# Patient Record
Sex: Male | Born: 1975 | Race: White | Hispanic: No | Marital: Married | Smoking: Current every day smoker
Health system: Southern US, Community
[De-identification: ages and names within clinical notes are randomized; demographics above are authoritative.]

## PROBLEM LIST (undated history)

## (undated) DIAGNOSIS — Z9889 Other specified postprocedural states: Secondary | ICD-10-CM

## (undated) DIAGNOSIS — R112 Nausea with vomiting, unspecified: Secondary | ICD-10-CM

## (undated) DIAGNOSIS — M1990 Unspecified osteoarthritis, unspecified site: Secondary | ICD-10-CM

## (undated) DIAGNOSIS — K219 Gastro-esophageal reflux disease without esophagitis: Secondary | ICD-10-CM

## (undated) DIAGNOSIS — I10 Essential (primary) hypertension: Secondary | ICD-10-CM

## (undated) HISTORY — DX: Unspecified osteoarthritis, unspecified site: M19.90

## (undated) HISTORY — PX: OTHER SURGICAL HISTORY: SHX169

## (undated) HISTORY — DX: Gastro-esophageal reflux disease without esophagitis: K21.9

## (undated) HISTORY — PX: KNEE ARTHROSCOPY: SUR90

## (undated) HISTORY — DX: Essential (primary) hypertension: I10

---

## 2012-02-17 ENCOUNTER — Emergency Department: Payer: Self-pay | Admitting: Emergency Medicine

## 2012-11-14 ENCOUNTER — Emergency Department: Payer: Self-pay | Admitting: Emergency Medicine

## 2012-11-14 LAB — CBC
HCT: 46.2 % (ref 40.0–52.0)
HGB: 16 g/dL (ref 13.0–18.0)
MCH: 30 pg (ref 26.0–34.0)
MCHC: 34.7 g/dL (ref 32.0–36.0)
MCV: 87 fL (ref 80–100)
RBC: 5.34 10*6/uL (ref 4.40–5.90)
WBC: 13.2 10*3/uL — ABNORMAL HIGH (ref 3.8–10.6)

## 2012-11-14 LAB — BASIC METABOLIC PANEL
BUN: 17 mg/dL (ref 7–18)
Calcium, Total: 8.9 mg/dL (ref 8.5–10.1)
Co2: 25 mmol/L (ref 21–32)
EGFR (Non-African Amer.): >60
Glucose: 93 mg/dL (ref 65–99)
Osmolality: 277 (ref 275–301)
Potassium: 3.9 mmol/L (ref 3.5–5.1)
Sodium: 138 mmol/L (ref 136–145)

## 2012-11-14 LAB — TROPONIN I: Troponin-I: <0.02 ng/mL

## 2015-02-21 ENCOUNTER — Emergency Department
Admission: EM | Admit: 2015-02-21 | Discharge: 2015-02-21 | Disposition: A | Discharge status: Home / Self Care | Payer: BLUE CROSS/BLUE SHIELD | Attending: Emergency Medicine | Admitting: Emergency Medicine

## 2015-02-21 ENCOUNTER — Other Ambulatory Visit: Payer: Self-pay

## 2015-02-21 ENCOUNTER — Emergency Department: Payer: BLUE CROSS/BLUE SHIELD

## 2015-02-21 ENCOUNTER — Encounter: Payer: Self-pay | Admitting: Emergency Medicine

## 2015-02-21 DIAGNOSIS — R112 Nausea with vomiting, unspecified: Secondary | ICD-10-CM | POA: Diagnosis not present

## 2015-02-21 DIAGNOSIS — R1013 Epigastric pain: Secondary | ICD-10-CM

## 2015-02-21 DIAGNOSIS — R079 Chest pain, unspecified: Secondary | ICD-10-CM | POA: Insufficient documentation

## 2015-02-21 DIAGNOSIS — Z720 Tobacco use: Secondary | ICD-10-CM | POA: Diagnosis not present

## 2015-02-21 LAB — COMPREHENSIVE METABOLIC PANEL
ALBUMIN: 4.6 g/dL (ref 3.5–5.0)
ALT: 68 U/L — ABNORMAL HIGH (ref 17–63)
AST: 35 U/L (ref 15–41)
Alkaline Phosphatase: 90 U/L (ref 38–126)
Anion gap: 9 (ref 5–15)
BUN: 19 mg/dL (ref 6–20)
CHLORIDE: 104 mmol/L (ref 101–111)
CO2: 25 mmol/L (ref 22–32)
Calcium: 9.2 mg/dL (ref 8.9–10.3)
Creatinine, Ser: 1.29 mg/dL — ABNORMAL HIGH (ref 0.61–1.24)
GFR calc Af Amer: >60 mL/min (ref >60)
GLUCOSE: 112 mg/dL — AB (ref 65–99)
POTASSIUM: 3.8 mmol/L (ref 3.5–5.1)
SODIUM: 138 mmol/L (ref 135–145)
Total Bilirubin: 0.3 mg/dL (ref 0.3–1.2)
Total Protein: 7.9 g/dL (ref 6.5–8.1)

## 2015-02-21 LAB — URINALYSIS COMPLETE WITH MICROSCOPIC (ARMC ONLY)
BACTERIA UA: NONE SEEN
BILIRUBIN URINE: NEGATIVE
Glucose, UA: NEGATIVE mg/dL
Hgb urine dipstick: NEGATIVE
Ketones, ur: NEGATIVE mg/dL
LEUKOCYTES UA: NEGATIVE
Nitrite: NEGATIVE
PH: 7 (ref 5.0–8.0)
PROTEIN: NEGATIVE mg/dL
SQUAMOUS EPITHELIAL / LPF: NONE SEEN
Specific Gravity, Urine: 1.021 (ref 1.005–1.030)
WBC, UA: NONE SEEN WBC/hpf (ref 0–5)

## 2015-02-21 LAB — CBC
HEMATOCRIT: 44.9 % (ref 40.0–52.0)
Hemoglobin: 15.3 g/dL (ref 13.0–18.0)
MCH: 29.5 pg (ref 26.0–34.0)
MCHC: 34 g/dL (ref 32.0–36.0)
MCV: 86.8 fL (ref 80.0–100.0)
Platelets: 277 10*3/uL (ref 150–440)
RBC: 5.17 MIL/uL (ref 4.40–5.90)
RDW: 13.2 % (ref 11.5–14.5)
WBC: 13.1 10*3/uL — AB (ref 3.8–10.6)

## 2015-02-21 LAB — TROPONIN I: Troponin I: <0.03 ng/mL (ref <0.031)

## 2015-02-21 LAB — LIPASE, BLOOD: LIPASE: 29 U/L (ref 22–51)

## 2015-02-21 MED ORDER — MORPHINE SULFATE (PF) 4 MG/ML IV SOLN
4.0000 mg | Freq: Once | INTRAVENOUS | Status: AC
Start: 2015-02-21 — End: 2015-02-21
  Administered 2015-02-21: 4 mg via INTRAVENOUS

## 2015-02-21 MED ORDER — ONDANSETRON HCL 4 MG/2ML IJ SOLN
4.0000 mg | Freq: Once | INTRAMUSCULAR | Status: AC
  Administered 2015-02-21: 4 mg via INTRAVENOUS

## 2015-02-21 MED ORDER — MORPHINE SULFATE (PF) 4 MG/ML IV SOLN
INTRAVENOUS | Status: AC
  Administered 2015-02-21: 4 mg via INTRAVENOUS
  Filled 2015-02-21: qty 1

## 2015-02-21 MED ORDER — PANTOPRAZOLE SODIUM 40 MG IV SOLR
40.0000 mg | Freq: Once | INTRAVENOUS | Status: AC
  Administered 2015-02-21: 40 mg via INTRAVENOUS
  Filled 2015-02-21: qty 40

## 2015-02-21 MED ORDER — SUCRALFATE 1 G PO TABS: 1.0000 g | ORAL_TABLET | Freq: Four times a day (QID) | ORAL | Status: DC

## 2015-02-21 MED ORDER — IOHEXOL 300 MG/ML  SOLN
100.0000 mL | Freq: Once | INTRAMUSCULAR | Status: AC | PRN
  Administered 2015-02-21: 100 mL via INTRAVENOUS

## 2015-02-21 MED ORDER — ONDANSETRON HCL 4 MG/2ML IJ SOLN
INTRAMUSCULAR | Status: AC
  Administered 2015-02-21: 4 mg via INTRAVENOUS
  Filled 2015-02-21: qty 2

## 2015-02-21 MED ORDER — PANTOPRAZOLE SODIUM 40 MG PO TBEC: 40.0000 mg | DELAYED_RELEASE_TABLET | Freq: Every day | ORAL | Status: DC

## 2015-02-21 MED ORDER — SODIUM CHLORIDE 0.9 % IV BOLUS (SEPSIS)
1000.0000 mL | Freq: Once | INTRAVENOUS | Status: AC
  Administered 2015-02-21: 1000 mL via INTRAVENOUS

## 2015-02-21 MED ORDER — SUCRALFATE 1 G PO TABS: 1.0000 g | ORAL_TABLET | Freq: Once | ORAL | Status: DC

## 2015-02-21 MED ORDER — IOHEXOL 240 MG/ML SOLN
25.0000 mL | Freq: Once | INTRAMUSCULAR | Status: AC | PRN
  Administered 2015-02-21: 25 mL via ORAL
  Filled 2015-02-21: qty 25

## 2015-02-21 MED ORDER — PANTOPRAZOLE SODIUM 40 MG IV SOLR
INTRAVENOUS | Status: AC
  Administered 2015-02-21: 40 mg via INTRAVENOUS
  Filled 2015-02-21: qty 40

## 2015-02-21 NOTE — ED Notes (Signed)
Patient transported to CT 

## 2015-02-21 NOTE — ED Notes (Signed)
MD at bedside. 

## 2015-02-21 NOTE — Discharge Instructions (Signed)
Abdominal Pain °Many things can cause abdominal pain. Usually, abdominal pain is not caused by a disease and will improve without treatment. It can often be observed and treated at home. Your health care provider will do a physical exam and possibly order blood tests and X-rays to help determine the seriousness of your pain. However, in many cases, more time must pass before a clear cause of the pain can be found. Before that point, your health care provider may not know if you need more testing or further treatment. °HOME CARE INSTRUCTIONS  °Monitor your abdominal pain for any changes. The following actions may help to alleviate any discomfort you are experiencing: °· Only take over-the-counter or prescription medicines as directed by your health care provider. °· Do not take laxatives unless directed to do so by your health care provider. °· Try a clear liquid diet (broth, tea, or water) as directed by your health care provider. Slowly move to a bland diet as tolerated. °SEEK MEDICAL CARE IF: °· You have unexplained abdominal pain. °· You have abdominal pain associated with nausea or diarrhea. °· You have pain when you urinate or have a bowel movement. °· You experience abdominal pain that wakes you in the night. °· You have abdominal pain that is worsened or improved by eating food. °· You have abdominal pain that is worsened with eating fatty foods. °· You have a fever. °SEEK IMMEDIATE MEDICAL CARE IF:  °· Your pain does not go away within 2 hours. °· You keep throwing up (vomiting). °· Your pain is felt only in portions of the abdomen, such as the right side or the left lower portion of the abdomen. °· You pass bloody or black tarry stools. °MAKE SURE YOU: °· Understand these instructions.   °· Will watch your condition.   °· Will get help right away if you are not doing well or get worse.   °Document Released: 03/27/2005 Document Revised: 06/22/2013 Document Reviewed: 02/24/2013 °Exit[employer]® Patient Information  ©2015 [employer]. This information is not intended to replace advice given to you by your health care provider. Make sure you discuss any questions you have with your health care provider. ° °Nausea and Vomiting °Nausea is a sick feeling that often comes before throwing up (vomiting). Vomiting is a reflex where stomach contents come out of your mouth. Vomiting can cause severe loss of body fluids (dehydration). Children and elderly adults can become dehydrated quickly, especially if they also have diarrhea. Nausea and vomiting are symptoms of a condition or disease. It is important to find the cause of your symptoms. °CAUSES  °· Direct irritation of the stomach lining. This irritation can result from increased acid production (gastroesophageal reflux disease), infection, food poisoning, taking certain medicines (such as nonsteroidal anti-inflammatory drugs), alcohol use, or tobacco use. °· Signals from the brain. These signals could be caused by a headache, heat exposure, an inner ear disturbance, increased pressure in the brain from injury, infection, a tumor, or a concussion, pain, emotional stimulus, or metabolic problems. °· An obstruction in the gastrointestinal tract (bowel obstruction). °· Illnesses such as diabetes, hepatitis, gallbladder problems, appendicitis, kidney problems, cancer, sepsis, atypical symptoms of a heart attack, or eating disorders. °· Medical treatments such as chemotherapy and radiation. °· Receiving medicine that makes you sleep (general anesthetic) during surgery. °DIAGNOSIS °Your caregiver may ask for tests to be done if the problems do not improve after a few days. Tests may also be done if symptoms are severe or if the reason for the nausea   and vomiting is not clear. Tests may include: °· Urine tests. °· Blood tests. °· Stool tests. °· Cultures (to look for evidence of infection). °· X-rays or other imaging studies. °Test results can help your caregiver make decisions about  treatment or the need for additional tests. °TREATMENT °You need to stay well hydrated. Drink frequently but in small amounts. You may wish to drink water, sports drinks, clear broth, or eat frozen ice pops or gelatin dessert to help stay hydrated. When you eat, eating slowly may help prevent nausea. There are also some antinausea medicines that may help prevent nausea. °HOME CARE INSTRUCTIONS  °· Take all medicine as directed by your caregiver. °· If you do not have an appetite, do not force yourself to eat. However, you must continue to drink fluids. °· If you have an appetite, eat a normal diet unless your caregiver tells you differently. °¨ Eat a variety of complex carbohydrates (rice, wheat, potatoes, bread), lean meats, yogurt, fruits, and vegetables. °¨ Avoid high-fat foods because they are more difficult to digest. °· Drink enough water and fluids to keep your urine clear or pale yellow. °· If you are dehydrated, ask your caregiver for specific rehydration instructions. Signs of dehydration may include: °¨ Severe thirst. °¨ Dry lips and mouth. °¨ Dizziness. °¨ Dark urine. °¨ Decreasing urine frequency and amount. °¨ Confusion. °¨ Rapid breathing or pulse. °SEEK IMMEDIATE MEDICAL CARE IF:  °· You have blood or brown flecks (like coffee grounds) in your vomit. °· You have black or bloody stools. °· You have a severe headache or stiff neck. °· You are confused. °· You have severe abdominal pain. °· You have chest pain or trouble breathing. °· You do not urinate at least once every 8 hours. °· You develop cold or clammy skin. °· You continue to vomit for longer than 24 to 48 hours. °· You have a fever. °MAKE SURE YOU:  °· Understand these instructions. °· Will watch your condition. °· Will get help right away if you are not doing well or get worse. °Document Released: 06/17/2005 Document Revised: 09/09/2011 Document Reviewed: 11/14/2010 °ExitCare® Patient Information ©2015 [employer]. This information is not  intended to replace advice given to you by your health care provider. Make sure you discuss any questions you have with your health care provider. ° °

## 2015-02-21 NOTE — ED Provider Notes (Signed)
[facility] Emergency Department Provider Note  ____________________________________________  Time seen: Approximately 809 PM  I have reviewed the triage vital signs and the nursing notes.   HISTORY  Chief Complaint Abdominal Pain    HPI Steven Woodard is a 39 y.o. male without any chronic medical problems with 2 weeks of worsening epigastric abdominal pain. He says that the pain feels sharp and is worsened after he eats. It is been worse over the past 2 days and associated with 2 episodes of vomiting. The vomiting was not bloody. There is been no diarrhea. Tried Pepto-Bismol without any relief. Denies any drinking. Does not have a primary care doctor.Says that when he leaned back last night while playing video games at this son that it felt as if it was a tearing sensation in his upper abdomen. Has been able to move his bowels and passed gas today.   History reviewed. No pertinent past medical history.  There are no active problems to display for this patient.   Past Surgical History  Procedure Laterality Date  . Knee arthroscopy Left     No current outpatient prescriptions on file.  Allergies Aspirin  No family history on file.  Social History Social History  Substance Use Topics  . Smoking status: Current Every Day Smoker -- 0.50 packs/day    Types: Cigarettes  . Smokeless tobacco: None  . Alcohol Use: No    Review of Systems Constitutional: No fever/chills Eyes: No visual changes. ENT: No sore throat. Cardiovascular: Ongoing chest pain now for 5 months. Has been previously evaluated for this and been told it was a pulled muscle. No acute chest pain at this time. No complaints of shortness of breath. Respiratory: Denies shortness of breath. Gastrointestinal: No diarrhea.  No constipation. Genitourinary: Negative for dysuria. Musculoskeletal: Negative for back pain. Skin: Negative for rash. Neurological: Negative for headaches, focal  weakness or numbness.  10-point ROS otherwise negative.  ____________________________________________   PHYSICAL EXAM:  VITAL SIGNS: ED Triage Vitals  Enc Vitals Group     BP 02/21/15 1939 140/94 mmHg     Pulse Rate 02/21/15 1939 78     Resp 02/21/15 1939 18     Temp 02/21/15 1939 98.5 F (36.9 C)     Temp Source 02/21/15 1939 Oral     SpO2 02/21/15 1939 99 %     Weight 02/21/15 1939 210 lb (95.255 kg)     Height 02/21/15 1939 5\' 8"  (1.727 m)     Head Cir --      Peak Flow --      Pain Score 02/21/15 1942 7     Pain Loc --      Pain Edu? --      Excl. in [place]? --     Constitutional: Alert and oriented. Well appearing and in no acute distress. Eyes: Conjunctivae are normal. PERRL. EOMI. Head: Atraumatic. Nose: No congestion/rhinnorhea. Mouth/Throat: Mucous membranes are moist.  Oropharynx non-erythematous. Neck: No stridor.   Cardiovascular: Normal rate, regular rhythm. Grossly normal heart sounds.  Good peripheral circulation. Respiratory: Normal respiratory effort.  No retractions. Lungs CTAB. Gastrointestinal: Soft with mild tenderness in the epigastrium and right upper quadrant. There is a negative Murphy sign. There is no tenderness over McBurney's point. No rebound or guarding or rigidity.. No distention. No abdominal bruits. No CVA tenderness. Musculoskeletal: No lower extremity tenderness nor edema.  No joint effusions. Neurologic:  Normal speech and language. No gross focal neurologic deficits are appreciated. No gait  instability. Skin:  Skin is warm, dry and intact. No rash noted. Psychiatric: Mood and affect are normal. Speech and behavior are normal.  ____________________________________________   LABS (all labs ordered are listed, but only abnormal results are displayed)  Labs Reviewed  COMPREHENSIVE METABOLIC PANEL - Abnormal; Notable for the following:    Glucose, Bld 112 (*)    Creatinine, Ser 1.29 (*)    ALT 68 (*)    All other components within  normal limits  CBC - Abnormal; Notable for the following:    WBC 13.1 (*)    All other components within normal limits  URINALYSIS COMPLETEWITH MICROSCOPIC ([facility] ONLY) - Abnormal; Notable for the following:    Color, Urine YELLOW (*)    APPearance CLEAR (*)    All other components within normal limits  LIPASE, BLOOD  TROPONIN I   ____________________________________________  EKG  ED ECG REPORT I, Doran Stabler, the attending physician, personally viewed and interpreted this ECG.   Date: 02/21/2015  EKG Time: 1949  Rate: 76  Rhythm: normal EKG, normal sinus rhythm  Axis: Normal axis  Intervals:none  ST&T Change: No ST elevations or depressions. No abnormal T-wave inversions.  ____________________________________________  RADIOLOGY  No acute abnormality in the abdomen and pelvis. Fat-containing umbilical hernia as well as small fat containing inguinal hernias. Severe hepatic steatosis. ____________________________________________   PROCEDURES    ____________________________________________   INITIAL IMPRESSION / ASSESSMENT AND PLAN / ED COURSE  Pertinent labs & imaging results that were available during my care of the patient were reviewed by me and considered in my medical decision making (see chart for details).  ----------------------------------------- 11:03 PM on 02/21/2015 -----------------------------------------  Patient resting comfortably at this time and tolerated by mouth contrast without vomiting. Discussed the labs and imaging with the patient as well as the hepatic steatosis. I told the patient that it is possible that he has a gastric ulcer. He will be discharged on a proton pump inhibitor as well as Carafate. He will be given the phone number for follow-up for the [facility] clinic primary care as well as the gastroenterologist. He says he has insurance will be able to follow-up. We'll also discharge with an anti-medic. Appropriate for outpatient  treatment. Tolerating by mouth at this time and reassuring labs and imaging. Patient clinically also appears well without any acute distress and pain is improved after medication in the emergency department. ____________________________________________   FINAL CLINICAL IMPRESSION(S) / ED DIAGNOSES  Acute epigastric abdominal pain with nausea and vomiting. Initial visit.    Orbie Pyo, MD 02/21/15 [phone]
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
## 2015-03-26 ENCOUNTER — Emergency Department: Payer: BLUE CROSS/BLUE SHIELD

## 2015-03-26 ENCOUNTER — Emergency Department
Admission: EM | Admit: 2015-03-26 | Discharge: 2015-03-26 | Disposition: A | Discharge status: Home / Self Care | Payer: BLUE CROSS/BLUE SHIELD | Attending: Student | Admitting: Student

## 2015-03-26 DIAGNOSIS — S93602A Unspecified sprain of left foot, initial encounter: Secondary | ICD-10-CM | POA: Diagnosis not present

## 2015-03-26 DIAGNOSIS — Y998 Other external cause status: Secondary | ICD-10-CM | POA: Insufficient documentation

## 2015-03-26 DIAGNOSIS — X58XXXA Exposure to other specified factors, initial encounter: Secondary | ICD-10-CM | POA: Insufficient documentation

## 2015-03-26 DIAGNOSIS — Y9389 Activity, other specified: Secondary | ICD-10-CM | POA: Insufficient documentation

## 2015-03-26 DIAGNOSIS — Y92832 Beach as the place of occurrence of the external cause: Secondary | ICD-10-CM | POA: Diagnosis not present

## 2015-03-26 DIAGNOSIS — S99922A Unspecified injury of left foot, initial encounter: Secondary | ICD-10-CM | POA: Diagnosis present

## 2015-03-26 DIAGNOSIS — S93402A Sprain of unspecified ligament of left ankle, initial encounter: Secondary | ICD-10-CM | POA: Diagnosis not present

## 2015-03-26 DIAGNOSIS — Z720 Tobacco use: Secondary | ICD-10-CM | POA: Insufficient documentation

## 2015-03-26 MED ORDER — OXYCODONE-ACETAMINOPHEN 5-325 MG PO TABS: 1.0000 | ORAL_TABLET | ORAL | Status: DC | PRN

## 2015-03-26 MED ORDER — OXYCODONE-ACETAMINOPHEN 5-325 MG PO TABS
1.0000 | ORAL_TABLET | Freq: Once | ORAL | Status: AC
  Administered 2015-03-26: 1 via ORAL
  Filled 2015-03-26: qty 1

## 2015-03-26 NOTE — ED Notes (Signed)
Pt reports that he twisted left foot/ankle yesterday.

## 2015-03-26 NOTE — Discharge Instructions (Signed)
Ankle Sprain  An ankle sprain is an injury to the strong, fibrous tissues (ligaments) that hold your ankle bones together.   HOME CARE   · Put ice on your ankle for 1-2 days or as told by your doctor.  ¨ Put ice in a plastic bag.  ¨ Place a towel between your skin and the bag.  ¨ Leave the ice on for 15-20 minutes at a time, every 2 hours while you are awake.  · Only take medicine as told by your doctor.  · Raise (elevate) your injured ankle above the level of your heart as much as possible for 2-3 days.  · Use crutches if your doctor tells you to. Slowly put your own weight on the affected ankle. Use the crutches until you can walk without pain.  · If you have a plaster splint:  ¨ Do not rest it on anything harder than a pillow for 24 hours.  ¨ Do not put weight on it.  ¨ Do not get it wet.  ¨ Take it off to shower or bathe.  · If given, use an elastic wrap or support stocking for support. Take the wrap off if your toes lose feeling (numb), tingle, or turn cold or blue.  · If you have an air splint:  ¨ Add or let out air to make it comfortable.  ¨ Take it off at night and to shower and bathe.  ¨ Wiggle your toes and move your ankle up and down often while you are wearing it.  GET HELP IF:  · You have rapidly increasing bruising or puffiness (swelling).  · Your toes feel very cold.  · You lose feeling in your foot.  · Your medicine does not help your pain.  GET HELP RIGHT AWAY IF:   · Your toes lose feeling (numb) or turn blue.  · You have severe pain that is increasing.  MAKE SURE YOU:   · Understand these instructions.  · Will watch your condition.  · Will get help right away if you are not doing well or get worse.  Document Released: 12/04/2007 Document Revised: 11/01/2013 Document Reviewed: 12/30/2011  ExitCare® Patient Information ©2015 [employer]. This information is not intended to replace advice given to you by your health care provider. Make sure you discuss any questions you have with your health care  provider.
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
## 2015-07-14 ENCOUNTER — Encounter: Payer: Self-pay | Admitting: Nurse Practitioner

## 2015-07-14 ENCOUNTER — Ambulatory Visit (INDEPENDENT_AMBULATORY_CARE_PROVIDER_SITE_OTHER): Payer: BLUE CROSS/BLUE SHIELD | Admitting: Nurse Practitioner

## 2015-07-14 VITALS — BP 138/98 | HR 107 | Temp 98.1°F | Resp 16 | Ht 67.0 in | Wt 216.0 lb

## 2015-07-14 DIAGNOSIS — B9789 Other viral agents as the cause of diseases classified elsewhere: Secondary | ICD-10-CM

## 2015-07-14 DIAGNOSIS — Z7689 Persons encountering health services in other specified circumstances: Secondary | ICD-10-CM

## 2015-07-14 DIAGNOSIS — K219 Gastro-esophageal reflux disease without esophagitis: Secondary | ICD-10-CM

## 2015-07-14 DIAGNOSIS — Z7189 Other specified counseling: Secondary | ICD-10-CM | POA: Diagnosis not present

## 2015-07-14 DIAGNOSIS — M25552 Pain in left hip: Secondary | ICD-10-CM

## 2015-07-14 DIAGNOSIS — I10 Essential (primary) hypertension: Secondary | ICD-10-CM

## 2015-07-14 DIAGNOSIS — J069 Acute upper respiratory infection, unspecified: Secondary | ICD-10-CM

## 2015-07-14 MED ORDER — MAGIC MOUTHWASH W/LIDOCAINE: 5.0000 mL | Freq: Four times a day (QID) | ORAL | Status: DC

## 2015-07-14 NOTE — Patient Instructions (Addendum)
Generic Hip Exercises RANGE OF MOTION (ROM) AND STRETCHING EXERCISES  These exercises may help you when beginning to rehabilitate your injury. Doing them too aggressively can worsen your condition. Complete them slowly and gently. Your symptoms may resolve with or without further involvement from your physician, physical therapist or athletic trainer. While completing these exercises, remember:   Restoring tissue flexibility helps normal motion to return to the joints. This allows healthier, less painful movement and activity.  An effective stretch should be held for at least 30 seconds.  A stretch should never be painful. You should only feel a gentle lengthening or release in the stretched tissue. If these stretches worsen your symptoms even when done gently, consult your physician, physical therapist or athletic trainer. STRETCH - Hamstrings, Supine   Lie on your back. Loop a belt or towel over the ball of your right / left foot.  Straighten your right / left knee and slowly pull on the belt to raise your leg. Do not allow the right / left knee to bend. Keep your opposite leg flat on the floor.  Raise the leg until you feel a gentle stretch behind your right / left knee or thigh. Hold this position for __________ seconds. Repeat __________ times. Complete this stretch __________ times per day.  STRETCH - Hip Rotators   Lie on your back on a firm surface. Grasp your right / left knee with your right / left hand and your ankle with your opposite hand.  Keeping your hips and shoulders firmly planted, gently pull your right / left knee and rotate your lower leg toward your opposite shoulder until you feel a stretch in your buttocks.  Hold this stretch for __________ seconds. Repeat this stretch __________ times. Complete this stretch __________ times per day. STRETCH - Hamstrings/Adductors, V-Sit   Sit on the floor with your legs extended in a large "V," keeping your knees straight.  With  your head and chest upright, bend at your waist reaching for your right foot to stretch your left adductors.  You should feel a stretch in your left inner thigh. Hold for __________ seconds.  Return to the upright position to relax your leg muscles.  Continuing to keep your chest upright, bend straight forward at your waist to stretch your hamstrings.  You should feel a stretch behind both of your thighs and/or knees. Hold for __________ seconds.  Return to the upright position to relax your leg muscles.  Repeat steps 2 through 4 for opposite leg. Repeat __________ times. Complete this exercise __________ times per day.  STRETCHING - Hip Flexors, Lunge  Half kneel with your right / left knee on the floor and your opposite knee bent and directly over your ankle.  Keep good posture with your head over your shoulders. Tighten your buttocks to point your tailbone downward; this will prevent your back from arching too much.  You should feel a gentle stretch in the front of your thigh and/or hip. If you do not feel any resistance, slightly slide your opposite foot forward and then slowly lunge forward so your knee once again lines up over your ankle. Be sure your tailbone remains pointed downward.  Hold this stretch for __________ seconds. Repeat __________ times. Complete this stretch __________ times per day. STRENGTHENING EXERCISES These exercises may help you when beginning to rehabilitate your injury. They may resolve your symptoms with or without further involvement from your physician, physical therapist or athletic trainer. While completing these exercises, remember:     Muscles can gain both the endurance and the strength needed for everyday activities through controlled exercises.  Complete these exercises as instructed by your physician, physical therapist or athletic trainer. Progress the resistance and repetitions only as guided.  You may experience muscle soreness or fatigue, but  the pain or discomfort you are trying to eliminate should never worsen during these exercises. If this pain does worsen, stop and make certain you are following the directions exactly. If the pain is still present after adjustments, discontinue the exercise until you can discuss the trouble with your clinician. STRENGTH - Hip Extensors, Bridge   Lie on your back on a firm surface. Bend your knees and place your feet flat on the floor.  Tighten your buttocks muscles and lift your bottom off the floor until your trunk is level with your thighs. You should feel the muscles in your buttocks and back of your thighs working. If you do not feel these muscles, slide your feet 1-2 inches further away from your buttocks.  Hold this position for __________ seconds.  Slowly lower your hips to the starting position and allow your buttock muscles relax completely before beginning the next repetition.  If this exercise is too easy, you may cross your arms over your chest. Repeat __________ times. Complete this exercise __________ times per day.  STRENGTH - Hip Abductors, Straight Leg Raises  Be aware of your form throughout the entire exercise so that you exercise the correct muscles. Sloppy form means that you are not strengthening the correct muscles.  Lie on your side so that your head, shoulders, knee and hip line up. You may bend your lower knee to help maintain your balance. Your right / left leg should be on top.  Roll your hips slightly forward, so that your hips are stacked directly over each other and your right / left knee is facing forward.  Lift your top leg up 4-6 inches, leading with your heel. Be sure that your foot does not drift forward or that your knee does not roll toward the ceiling.  Hold this position for __________ seconds. You should feel the muscles in your outer hip lifting (you may not notice this until your leg begins to tire).  Slowly lower your leg to the starting position.  Allow the muscles to fully relax before beginning the next repetition. Repeat __________ times. Complete this exercise __________ times per day.  STRENGTH - Hip Adductors, Straight Leg Raises   Lie on your side so that your head, shoulders, knee and hip line up. You may place your upper foot in front to help maintain your balance. Your right / left leg should be on the bottom.  Roll your hips slightly forward, so that your hips are stacked directly over each other and your right / left knee is facing forward.  Tense the muscles in your inner thigh and lift your bottom leg 4-6 inches. Hold this position for __________ seconds.  Slowly lower your leg to the starting position. Allow the muscles to fully relax before beginning the next repetition. Repeat __________ times. Complete this exercise __________ times per day.  STRENGTH - Quadriceps, Straight Leg Raises  Quality counts! Watch for signs that the quadriceps muscle is working to insure you are strengthening the correct muscles and not "cheating" by substituting with healthier muscles.  Lay on your back with your right / left leg extended and your opposite knee bent.  Tense the muscles in the front of your right /   left thigh. You should see either your knee cap slide up or increased dimpling just above the knee. Your thigh may even quiver.  Tighten these muscles even more and raise your leg 4 to 6 inches off the floor. Hold for right / left seconds.  Keeping these muscles tense, lower your leg.  Relax the muscles slowly and completely in between each repetition. Repeat __________ times. Complete this exercise __________ times per day.  STRENGTH - Hip Abductors, Standing  Tie one end of a rubber exercise band/tubing to a secure surface (table, pole) and tie a loop at the other end.  Place the loop around your right / left ankle. Keeping your ankle with the band directly opposite of the secured end, step away until there is tension in  the tube/band.  Hold onto a chair as needed for balance.  Keeping your back upright, your shoulders over your hips, and your toes pointing forward, lift your right / left leg out to your side. Be sure to lift your leg with your hip muscles. Do not "throw" your leg or tip your body to lift your leg.  Slowly and with control, return to the starting position. Repeat exercise __________ times. Complete this exercise __________ times per day.  STRENGTH - Quadriceps, Squats  Stand in a door frame so that your feet and knees are in line with the frame.  Use your hands for balance, not support, on the frame.  Slowly lower your weight, bending at the hips and knees. Keep your lower legs upright so that they are parallel with the door frame. Squat only within the range that does not increase your knee pain. Never let your hips drop below your knees.  Slowly return upright, pushing with your legs, not pulling with your hands.   This information is not intended to replace advice given to you by your health care provider. Make sure you discuss any questions you have with your health care provider.   Document Released: 07/05/2005 Document Revised: 07/08/2014 Document Reviewed: 09/29/2008 Elsevier Interactive Patient Education 2016 [employer]  

## 2015-07-14 NOTE — Progress Notes (Signed)
Patient ID: Steven Woodard, male    DOB: May 06, 1976  Age: 40 y.o. MRN: [id number]  CC: Establish Care; Sore Throat; and Hip Pain   HPI Steven Woodard presents for establishing care and CC of sore throat, hip pain on left, sleep apnea ect.Marland KitchenMarland Kitchen  1) ST x 2 weeks, Right side only, intermittent, able to swallow food and liquids   2) Hip pain on left- sitting for awhile going from sitting to standing, lower back to knee on left   4 months ago   Mild-moderate  Helps when starts moving   No stretches  No prior imaging  Left knee arthroscopy 40 yo  Quit breathing when sleeping per wife, does not wake himself up coughing  History [name] has a past medical history of Arthritis; GERD (gastroesophageal reflux disease); and Hypertension.   He has past surgical history that includes Knee arthroscopy (Left).   His family history includes Cancer in his father; Heart disease in his father; Stroke in his father.He reports that he has been smoking Cigarettes.  He has been smoking about 0.50 packs per day. He does not have any smokeless tobacco history on file. He reports that he does not drink alcohol or use illicit drugs.  Outpatient Prescriptions Prior to Visit  Medication Sig Dispense Refill  . oxyCODONE-acetaminophen (PERCOCET) 5-325 MG per tablet Take 1 tablet by mouth every 4 (four) hours as needed for severe pain. (Patient not taking: Reported on 07/14/2015) 20 tablet 0  . pantoprazole (PROTONIX) 40 MG tablet Take 1 tablet (40 mg total) by mouth daily. (Patient not taking: Reported on 07/14/2015) 30 tablet 1  . sucralfate (CARAFATE) 1 G tablet Take 1 tablet (1 g total) by mouth 4 (four) times daily. (Patient not taking: Reported on 07/14/2015) 60 tablet 1   No facility-administered medications prior to visit.    ROS Review of Systems  Constitutional: Negative for fever, chills, diaphoresis and fatigue.  HENT: Positive for sore throat. Negative for congestion, ear discharge, ear pain, postnasal drip,  rhinorrhea, sinus pressure, sneezing, tinnitus, trouble swallowing and voice change.   Eyes: Negative for visual disturbance.  Respiratory: Positive for cough. Negative for chest tightness, shortness of breath and wheezing.   Cardiovascular: Negative for chest pain, palpitations and leg swelling.  Gastrointestinal: Negative for nausea, vomiting and diarrhea.  Musculoskeletal: Positive for back pain and arthralgias.  Skin: Negative for rash.  Neurological: Negative for dizziness, weakness and numbness.       Denies losing bowel/bladder function, saddle anesthesia, or weakness of extremities.   Psychiatric/Behavioral: Positive for sleep disturbance. Negative for suicidal ideas. The patient is not nervous/anxious.     Objective:  BP 138/98 mmHg  Pulse 107  Temp(Src) 98.1 F (36.7 C) (Oral)  Resp 16  Ht 5\' 7"  (1.702 m)  Wt 216 lb (97.977 kg)  BMI 33.82 kg/m2  SpO2 98% Repeat 102  Physical Exam  Constitutional: He is oriented to person, place, and time. He appears well-developed and well-nourished. No distress.  HENT:  Head: Normocephalic and atraumatic.  Right Ear: External ear normal.  Left Ear: External ear normal.  Mouth/Throat: No oropharyngeal exudate.  TMs clear bilaterally  Eyes: EOM are normal. Pupils are equal, round, and reactive to light. Right eye exhibits no discharge. Left eye exhibits no discharge. No scleral icterus.  Neck: Normal range of motion. Neck supple.  Cardiovascular: Regular rhythm and normal heart sounds.  Exam reveals no gallop and no friction rub.   No murmur heard. Pulmonary/Chest: Effort normal and  breath sounds normal. No respiratory distress. He has no wheezes. He has no rales. He exhibits no tenderness.  Musculoskeletal: Normal range of motion. He exhibits tenderness. He exhibits no edema.  Lateral tenderness of left hip  No edema  Normal ROM  Lymphadenopathy:    He has no cervical adenopathy.  Neurological: He is alert and oriented to person,  place, and time.  Skin: Skin is warm and dry. No rash noted. He is not diaphoretic.  Psychiatric: He has a normal mood and affect. His behavior is normal. Judgment and thought content normal.   Assessment & Plan:   Steven Woodard was seen today for establish care, sore throat and hip pain.  Diagnoses and all orders for this visit:  Encounter to establish care  Viral URI with cough  Left hip pain  Benign essential HTN  Gastroesophageal reflux disease without esophagitis  Other orders -     magic mouthwash w/lidocaine SOLN; Take 5 mLs by mouth 4 (four) times daily.  I have discontinued Mr. Zarzycki's sucralfate, pantoprazole, and oxyCODONE-acetaminophen. I am also having him start on magic mouthwash w/lidocaine. Additionally, I am having him maintain his Ibuprofen.  Meds ordered this encounter  Medications  . Ibuprofen (RA IBUPROFEN) 200 MG CAPS    Sig: Take by mouth.  . magic mouthwash w/lidocaine SOLN    Sig: Take 5 mLs by mouth 4 (four) times daily.    Dispense:  120 mL    Refill:  0    Order Specific Question:  Supervising Provider    Answer:  Crecencio Mc [[id number]]     Follow-up: Return in about 4 weeks (around 08/11/2015) for CPE with fasting labs.

## 2015-07-18 ENCOUNTER — Encounter: Payer: Self-pay | Admitting: Nurse Practitioner

## 2015-07-18 DIAGNOSIS — J069 Acute upper respiratory infection, unspecified: Secondary | ICD-10-CM | POA: Insufficient documentation

## 2015-07-18 DIAGNOSIS — I10 Essential (primary) hypertension: Secondary | ICD-10-CM | POA: Insufficient documentation

## 2015-07-18 DIAGNOSIS — B9789 Other viral agents as the cause of diseases classified elsewhere: Secondary | ICD-10-CM

## 2015-07-18 DIAGNOSIS — M25552 Pain in left hip: Secondary | ICD-10-CM | POA: Insufficient documentation

## 2015-07-18 DIAGNOSIS — K219 Gastro-esophageal reflux disease without esophagitis: Secondary | ICD-10-CM | POA: Insufficient documentation

## 2015-07-18 DIAGNOSIS — Z7689 Persons encountering health services in other specified circumstances: Secondary | ICD-10-CM | POA: Insufficient documentation

## 2015-07-18 NOTE — Assessment & Plan Note (Signed)
New problem Was on protonix and carafate for ulcerations Will follow

## 2015-07-18 NOTE — Assessment & Plan Note (Signed)
Discussed acute and chronic issues. Reviewed health maintenance measures, PFSHx, and immunizations. Obtain records from previous facility.   

## 2015-07-18 NOTE — Assessment & Plan Note (Signed)
New problem  Magic mouthwash with lido and instructions printed and given to pt FU in 4 weeks or sooner as needed

## 2015-07-18 NOTE — Assessment & Plan Note (Signed)
New problem Probable bursitis Need X-ray- but not at this time per pt Asked him to stretch and try exercises on handout RICE FU prn worsening/failure to improve.

## 2015-07-18 NOTE — Assessment & Plan Note (Signed)
New onset Pt has not been on medication previously If still elevated at next visit will place on medications

## 2015-08-14 ENCOUNTER — Ambulatory Visit (INDEPENDENT_AMBULATORY_CARE_PROVIDER_SITE_OTHER): Payer: BLUE CROSS/BLUE SHIELD | Admitting: Nurse Practitioner

## 2015-08-14 ENCOUNTER — Encounter: Payer: Self-pay | Admitting: Nurse Practitioner

## 2015-08-14 VITALS — BP 120/70 | HR 89 | Temp 98.1°F | Resp 14 | Ht 67.0 in

## 2015-08-14 DIAGNOSIS — Z0000 Encounter for general adult medical examination without abnormal findings: Secondary | ICD-10-CM | POA: Diagnosis not present

## 2015-08-14 DIAGNOSIS — G43909 Migraine, unspecified, not intractable, without status migrainosus: Secondary | ICD-10-CM | POA: Insufficient documentation

## 2015-08-14 DIAGNOSIS — M25552 Pain in left hip: Secondary | ICD-10-CM | POA: Diagnosis not present

## 2015-08-14 DIAGNOSIS — R7989 Other specified abnormal findings of blood chemistry: Secondary | ICD-10-CM

## 2015-08-14 DIAGNOSIS — Z0001 Encounter for general adult medical examination with abnormal findings: Secondary | ICD-10-CM

## 2015-08-14 DIAGNOSIS — Z23 Encounter for immunization: Secondary | ICD-10-CM

## 2015-08-14 DIAGNOSIS — K219 Gastro-esophageal reflux disease without esophagitis: Secondary | ICD-10-CM | POA: Diagnosis not present

## 2015-08-14 DIAGNOSIS — H9191 Unspecified hearing loss, right ear: Secondary | ICD-10-CM | POA: Diagnosis not present

## 2015-08-14 DIAGNOSIS — F17200 Nicotine dependence, unspecified, uncomplicated: Secondary | ICD-10-CM

## 2015-08-14 DIAGNOSIS — I10 Essential (primary) hypertension: Secondary | ICD-10-CM | POA: Diagnosis not present

## 2015-08-14 DIAGNOSIS — G4730 Sleep apnea, unspecified: Secondary | ICD-10-CM

## 2015-08-14 DIAGNOSIS — G43109 Migraine with aura, not intractable, without status migrainosus: Secondary | ICD-10-CM

## 2015-08-14 DIAGNOSIS — H9190 Unspecified hearing loss, unspecified ear: Secondary | ICD-10-CM | POA: Insufficient documentation

## 2015-08-14 LAB — CBC WITH DIFFERENTIAL/PLATELET
BASOS ABS: 0 10*3/uL (ref 0.0–0.1)
Basophils Relative: 0.4 % (ref 0.0–3.0)
Eosinophils Absolute: 0.3 10*3/uL (ref 0.0–0.7)
Eosinophils Relative: 2.6 % (ref 0.0–5.0)
HCT: 47.6 % (ref 39.0–52.0)
Hemoglobin: 15.8 g/dL (ref 13.0–17.0)
LYMPHS ABS: 2.6 10*3/uL (ref 0.7–4.0)
Lymphocytes Relative: 26.5 % (ref 12.0–46.0)
MCHC: 33.2 g/dL (ref 30.0–36.0)
MCV: 88.9 fl (ref 78.0–100.0)
MONO ABS: 0.6 10*3/uL (ref 0.1–1.0)
Monocytes Relative: 6.2 % (ref 3.0–12.0)
NEUTROS ABS: 6.3 10*3/uL (ref 1.4–7.7)
NEUTROS PCT: 64.3 % (ref 43.0–77.0)
PLATELETS: 260 10*3/uL (ref 150.0–400.0)
RBC: 5.35 Mil/uL (ref 4.22–5.81)
RDW: 13.3 % (ref 11.5–15.5)
WBC: 9.9 10*3/uL (ref 4.0–10.5)

## 2015-08-14 LAB — COMPREHENSIVE METABOLIC PANEL
ALK PHOS: 84 U/L (ref 39–117)
ALT: 45 U/L (ref 0–53)
AST: 21 U/L (ref 0–37)
Albumin: 4.5 g/dL (ref 3.5–5.2)
BILIRUBIN TOTAL: 0.3 mg/dL (ref 0.2–1.2)
BUN: 16 mg/dL (ref 6–23)
CO2: 28 meq/L (ref 19–32)
CREATININE: 1.05 mg/dL (ref 0.40–1.50)
Calcium: 9.3 mg/dL (ref 8.4–10.5)
Chloride: 106 mEq/L (ref 96–112)
GFR: 84.4 mL/min (ref >60.00)
GLUCOSE: 107 mg/dL — AB (ref 70–99)
Potassium: 4.9 mEq/L (ref 3.5–5.1)
SODIUM: 140 meq/L (ref 135–145)
TOTAL PROTEIN: 7 g/dL (ref 6.0–8.3)

## 2015-08-14 LAB — TSH: TSH: 0.96 u[IU]/mL (ref 0.35–4.50)

## 2015-08-14 LAB — LIPID PANEL
Cholesterol: 186 mg/dL (ref 0–200)
HDL: 28.2 mg/dL — AB (ref >39.00)
NONHDL: 158.24
TRIGLYCERIDES: 245 mg/dL — AB (ref 0.0–149.0)
Total CHOL/HDL Ratio: 7
VLDL: 49 mg/dL — ABNORMAL HIGH (ref 0.0–40.0)

## 2015-08-14 LAB — LDL CHOLESTEROL, DIRECT: Direct LDL: 109 mg/dL

## 2015-08-14 LAB — HEMOGLOBIN A1C: HEMOGLOBIN A1C: 5.8 % (ref 4.6–6.5)

## 2015-08-14 MED ORDER — BUTALBITAL-APAP-CAFFEINE 50-325-40 MG PO TABS: 1.0000 | ORAL_TABLET | Freq: Four times a day (QID) | ORAL | Status: AC | PRN

## 2015-08-14 MED ORDER — VARENICLINE TARTRATE 0.5 MG X 11 & 1 MG X 42 PO MISC: ORAL | Status: DC

## 2015-08-14 MED ORDER — MELOXICAM 15 MG PO TABS: 15.0000 mg | ORAL_TABLET | Freq: Every day | ORAL | Status: DC

## 2015-08-14 NOTE — Assessment & Plan Note (Signed)
Referred for sleep study New problem  Will obtain labs today

## 2015-08-14 NOTE — Assessment & Plan Note (Signed)
Probable from low back  Will try Mobic (advised not to use with other NSAIDs) FU in 4 weeks

## 2015-08-14 NOTE — Assessment & Plan Note (Signed)
Pt interested in quitting Willing to try Chantix Printed and sent with pt to pharmacy FU in 4 weeks

## 2015-08-14 NOTE — Assessment & Plan Note (Signed)
BP Readings from Last 3 Encounters:  08/14/15 120/70  07/14/15 138/98  03/26/15 126/76   Improved. Will get labs and sleep study to evaluate other causes of intermittent elevated BP.

## 2015-08-14 NOTE — Patient Instructions (Signed)

## 2015-08-14 NOTE — Progress Notes (Signed)
Patient ID: Steven Woodard, male    DOB: April 10, 1976  Age: 40 y.o. MRN: [id number]  CC: Annual Exam   HPI Steven Woodard presents for Annual exam today and CC of Knee/Hip pain on left, HA, snoring/apnea events, and hearing loss on right.   1) Annual Physical   Diet- Healthy 60% of the time, eats food from home   Exercise- Active, Bowflex machine at home  Immunizations-    Flu- Declined   Tdap- Today   Eye Exam- Glasses, UTD  Dental Exam- UTD  Labs- Fasting  Fall- Neg.   Depression- Neg.  Refills: Denies needing   Tobacco use: Cold [place], Patches, Gum, Vaping, E-cigs   Acute problems:   Hip and knee pain- Stretches not helpful, still having intermittent tingling in left leg to foot   HA- comes on about 20 minutes after aura, doesn't happen everytime Onset- a few years ago  Location- center to back of head  Duration - 3-4 x a week  Characteristics- Stiffness, pressure  Aggravating factors- Denies aggravating factors Relieving factors- Ibuprofen  Severity- Mild- moderate   Not disrupting work  Aura- peripheral vision kaleidescope, lasts for 5 minutes    Treatment to date: Excedrin migraine- not helpful   Snoring, wife notices stopping breathing several times at night  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   Steven Woodard has past surgical history that includes Knee arthroscopy (Left).   His family history includes Cancer in his father; Heart disease in his father; Stroke in his father.Steven Woodard reports that Steven Woodard has been smoking Cigarettes.  Steven Woodard has been smoking about 0.50 packs per day. Steven Woodard does not have any smokeless tobacco history on file. Steven Woodard reports that Steven Woodard does not drink alcohol or use illicit drugs.  Outpatient Prescriptions Prior to Visit  Medication Sig Dispense Refill  . Ibuprofen (RA IBUPROFEN) 200 MG CAPS Take by mouth.    . magic mouthwash  w/lidocaine SOLN Take 5 mLs by mouth 4 (four) times daily. (Patient not taking: Reported on 08/14/2015) 120 mL 0   No facility-administered medications prior to visit.    ROS Review of Systems  Constitutional: Negative for fever, chills, diaphoresis and fatigue.  HENT: Negative for tinnitus and trouble swallowing.   Eyes: Negative for visual disturbance.  Respiratory: Negative for chest tightness, shortness of breath and wheezing.   Cardiovascular: Negative for chest pain, palpitations and leg swelling.  Gastrointestinal: Negative for nausea, vomiting and diarrhea.  Musculoskeletal: Positive for myalgias, back pain and arthralgias. Negative for joint swelling, gait problem, neck pain and neck stiffness.       Left side from low back to foot  Skin: Negative for rash.  Neurological: Positive for headaches. Negative for dizziness, weakness and numbness.  Psychiatric/Behavioral: Negative for suicidal ideas and sleep disturbance. The patient is not nervous/anxious.     Objective:  BP 120/70 mmHg  Pulse 89  Temp(Src) 98.1 F (36.7 C) (Oral)  Resp 14  Ht 5\' 7"  (1.702 m)  SpO2 97%  Physical Exam  Constitutional: Steven Woodard is oriented to person, place, and time. Steven Woodard appears well-developed and well-nourished. No distress.  HENT:  Head: Normocephalic and atraumatic.  Right Ear: External ear normal.  Left Ear: External ear normal.  Nose: Nose normal.  Mouth/Throat: Oropharynx is clear and moist. No oropharyngeal exudate.   TM's clear bilaterally  Eyes: Conjunctivae and EOM are normal. Pupils  are equal, round, and reactive to light. Right eye exhibits no discharge. Left eye exhibits no discharge. No scleral icterus.  Glasses  Neck: Normal range of motion. Neck supple. No thyromegaly present.  Cardiovascular: Normal rate, regular rhythm, normal heart sounds and intact distal pulses.  Exam reveals no gallop and no friction rub.   No murmur heard. Pulmonary/Chest: Effort normal and breath sounds  normal. No respiratory distress. Steven Woodard has no wheezes. Steven Woodard has no rales. Steven Woodard exhibits no tenderness.  Abdominal: Soft. Bowel sounds are normal. Steven Woodard exhibits no distension and no mass. There is no tenderness. There is no rebound and no guarding.  Genitourinary:  Deferred male exam due to no complaints  Musculoskeletal: Normal range of motion. Steven Woodard exhibits tenderness. Steven Woodard exhibits no edema.  Lateral hip tender with hip external rotation  Lymphadenopathy:    Steven Woodard has no cervical adenopathy.  Neurological: Steven Woodard is alert and oriented to person, place, and time. Steven Woodard displays normal reflexes. No cranial nerve deficit. Steven Woodard exhibits normal muscle tone. Coordination normal.  Negative straight leg raise bilaterally Gait intact  Skin: Skin is warm and dry. No rash noted. Steven Woodard is not diaphoretic.  Psychiatric: Steven Woodard has a normal mood and affect. His behavior is normal. Judgment and thought content normal.    Assessment & Plan:   Steven Woodard was seen today for annual exam.  Diagnoses and all orders for this visit:  Routine general medical examination at a health care facility -     Comprehensive metabolic panel -     CBC with Differential/Platelet -     Hemoglobin A1c -     Lipid panel -     TSH  Left hip pain  Observed sleep apnea -     Ambulatory referral to Sleep Studies  Migraine with aura and without status migrainosus, not intractable  Hearing loss, right -     Ambulatory referral to Audiology  Tobacco use disorder  Benign essential HTN  Gastroesophageal reflux disease without esophagitis  Other orders -     butalbital-acetaminophen-caffeine (FIORICET) 50-325-40 MG tablet; Take 1 tablet by mouth every 6 (six) hours as needed for headache. -     meloxicam (MOBIC) 15 MG tablet; Take 1 tablet (15 mg total) by mouth daily. -     varenicline (CHANTIX PAK) 0.5 MG X 11 & 1 MG X 42 tablet; Take one 0.5 mg tablet by mouth once daily for 3 days, then increase to one 0.5 mg tablet twice daily for 4 days, then  increase to one 1 mg tablet twice daily.   I have discontinued Steven Woodard's magic mouthwash w/lidocaine. I am also having him start on butalbital-acetaminophen-caffeine, meloxicam, and varenicline. Additionally, I am having him maintain his Ibuprofen.  Meds ordered this encounter  Medications  . butalbital-acetaminophen-caffeine (FIORICET) 50-325-40 MG tablet    Sig: Take 1 tablet by mouth every 6 (six) hours as needed for headache.    Dispense:  20 tablet    Refill:  0    Order Specific Question:  Supervising Provider    Answer:  Deborra Medina L [[id number]]  . meloxicam (MOBIC) 15 MG tablet    Sig: Take 1 tablet (15 mg total) by mouth daily.    Dispense:  30 tablet    Refill:  0    Order Specific Question:  Supervising Provider    Answer:  Deborra Medina L [[id number]]  . varenicline (CHANTIX PAK) 0.5 MG X 11 & 1 MG X 42 tablet    Sig: Take one  0.5 mg tablet by mouth once daily for 3 days, then increase to one 0.5 mg tablet twice daily for 4 days, then increase to one 1 mg tablet twice daily.    Dispense:  53 tablet    Refill:  0    Order Specific Question:  Supervising Provider    Answer:  Crecencio Mc [[id number]]     Follow-up: Return in about 4 weeks (around 09/11/2015) for Follow up of HA and Left hip pain.

## 2015-08-14 NOTE — Assessment & Plan Note (Signed)
New problem.  Migraines with Aura  Will try Fioricet after discussing with pt FU in 4 weeks

## 2015-08-14 NOTE — Progress Notes (Signed)
Pre visit review using our clinic review tool, if applicable. No additional management support is needed unless otherwise documented below in the visit note. 

## 2015-08-14 NOTE — Assessment & Plan Note (Signed)
Stable. Will follow as needed

## 2015-08-14 NOTE — Assessment & Plan Note (Signed)
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

## 2015-08-14 NOTE — Assessment & Plan Note (Signed)
Discussed acute and chronic issues. Reviewed health maintenance measures, PFSHx, and immunizations. Obtain routine labs TSH, Lipid panel, CBC w/ diff, A1c, and CMET.   Tdap given today Declined HIV screening and flu vaccine

## 2015-09-08 ENCOUNTER — Other Ambulatory Visit: Payer: Self-pay | Admitting: Nurse Practitioner

## 2015-09-11 NOTE — Telephone Encounter (Signed)
Received refill request electronically Last refill 08/14/15 #53, last office visit same day Is it okay to refill?

## 2015-09-12 ENCOUNTER — Other Ambulatory Visit: Payer: Self-pay | Admitting: Nurse Practitioner

## 2015-09-13 ENCOUNTER — Ambulatory Visit: Payer: BLUE CROSS/BLUE SHIELD | Admitting: Nurse Practitioner

## 2015-09-13 ENCOUNTER — Other Ambulatory Visit: Payer: Self-pay | Admitting: Nurse Practitioner

## 2015-09-13 NOTE — Telephone Encounter (Signed)
Refilled 08/14/15. Please advise?

## 2015-09-14 ENCOUNTER — Ambulatory Visit (INDEPENDENT_AMBULATORY_CARE_PROVIDER_SITE_OTHER): Payer: BLUE CROSS/BLUE SHIELD | Admitting: Nurse Practitioner

## 2015-09-14 ENCOUNTER — Encounter: Payer: Self-pay | Admitting: Nurse Practitioner

## 2015-09-14 VITALS — BP 128/86 | HR 96 | Temp 98.2°F | Wt 216.0 lb

## 2015-09-14 DIAGNOSIS — M25552 Pain in left hip: Secondary | ICD-10-CM | POA: Diagnosis not present

## 2015-09-14 DIAGNOSIS — F17200 Nicotine dependence, unspecified, uncomplicated: Secondary | ICD-10-CM | POA: Diagnosis not present

## 2015-09-14 DIAGNOSIS — G43109 Migraine with aura, not intractable, without status migrainosus: Secondary | ICD-10-CM

## 2015-09-14 DIAGNOSIS — H9191 Unspecified hearing loss, right ear: Secondary | ICD-10-CM | POA: Diagnosis not present

## 2015-09-14 DIAGNOSIS — J029 Acute pharyngitis, unspecified: Secondary | ICD-10-CM

## 2015-09-14 MED ORDER — VARENICLINE TARTRATE 0.5 MG X 11 & 1 MG X 42 PO MISC: ORAL | Status: DC

## 2015-09-14 MED ORDER — MELOXICAM 15 MG PO TABS: 15.0000 mg | ORAL_TABLET | Freq: Every day | ORAL | Status: DC

## 2015-09-14 NOTE — Telephone Encounter (Signed)
Rx faxed to the pharmacy.

## 2015-09-14 NOTE — Progress Notes (Signed)
Pre visit review using our clinic review tool, if applicable. No additional management support is needed unless otherwise documented below in the visit note. 

## 2015-09-14 NOTE — Patient Instructions (Signed)

## 2015-09-14 NOTE — Progress Notes (Signed)
Patient ID: Steven Woodard, male    DOB: [birth year]  Age: 40 y.o. MRN: [id number]  CC: Hip Pain; Headache; and Nicotine Dependence   HPI Steven Woodard presents for follow up of multiple concerns-  1) Tobacco Use Disorder- 28 day starter pack- 2 cigs a day with that then started smoking more when couldn't get continuing pack script quickly  2) Left hip pain-  Meloxicam- first thing in the morning  Very helpful Reports Steven Woodard has had great improvement   3) Migraines- Fioricet- rested and took 1 and woke up fine   4) Hearing loss-   [place] on 09/25/15 appointment Denies changes or worsening  Works in a loud environment as a Dealer  5) ST- intermittent for a few days, no recent treatment to date   History Steven Woodard has a past medical history of Arthritis; GERD (gastroesophageal reflux disease); and Hypertension.   Steven Woodard has past surgical history that includes Knee arthroscopy (Left).   His family history includes Cancer in his father; Heart disease in his father; Stroke in his father.Steven Woodard reports that Steven Woodard has been smoking Cigarettes.  Steven Woodard has been smoking about 0.50 packs per day. Steven Woodard does not have any smokeless tobacco history on file. Steven Woodard reports that Steven Woodard does not drink alcohol or use illicit drugs.  Outpatient Prescriptions Prior to Visit  Medication Sig Dispense Refill  . butalbital-acetaminophen-caffeine (FIORICET) 50-325-40 MG tablet Take 1 tablet by mouth every 6 (six) hours as needed for headache. 20 tablet 0  . Ibuprofen (RA IBUPROFEN) 200 MG CAPS Take by mouth.    . varenicline (CHANTIX PAK) 0.5 MG X 11 & 1 MG X 42 tablet Take one 0.5 mg tablet by mouth once daily for 3 days, then increase to one 0.5 mg tablet twice daily for 4 days, then increase to one 1 mg tablet twice daily. 53 tablet 0  . meloxicam (MOBIC) 15 MG tablet Take 1 tablet (15 mg total) by mouth daily. 30 tablet 0   No facility-administered medications prior to visit.    ROS Review of Systems  Constitutional: Negative for  fever, chills, diaphoresis and fatigue.  HENT: Positive for hearing loss and sore throat. Negative for congestion, ear discharge, ear pain, postnasal drip, rhinorrhea, sinus pressure, sneezing, tinnitus, trouble swallowing and voice change.   Eyes: Negative for visual disturbance.  Respiratory: Negative for chest tightness, shortness of breath and wheezing.   Cardiovascular: Negative for chest pain, palpitations and leg swelling.  Gastrointestinal: Negative for nausea, vomiting and diarrhea.  Musculoskeletal: Positive for arthralgias.       Improving w/ meds  Skin: Negative for rash.  Neurological: Positive for headaches. Negative for dizziness, weakness and numbness.  Psychiatric/Behavioral: The patient is not nervous/anxious.     Objective:  BP 128/86 mmHg  Pulse 96  Temp(Src) 98.2 F (36.8 C) (Oral)  Wt 216 lb (97.977 kg)  SpO2 98%  Physical Exam  Constitutional: Steven Woodard is oriented to person, place, and time. Steven Woodard appears well-developed and well-nourished. No distress.  HENT:  Head: Normocephalic and atraumatic.  Right Ear: External ear normal.  Left Ear: External ear normal.  Mouth/Throat: Oropharynx is clear and moist. No oropharyngeal exudate.  Cardiovascular: Normal rate, regular rhythm and normal heart sounds.   Pulmonary/Chest: Effort normal and breath sounds normal. No respiratory distress. Steven Woodard has no wheezes. Steven Woodard has no rales. Steven Woodard exhibits no tenderness.  Neurological: Steven Woodard is alert and oriented to person, place, and time.  Skin: Skin is warm and dry. No rash noted. Steven Woodard  is not diaphoretic.  Psychiatric: Steven Woodard has a normal mood and affect. His behavior is normal. Judgment and thought content normal.   Assessment & Plan:   [name] was seen today for hip pain, headache and nicotine dependence.  Diagnoses and all orders for this visit:  Tobacco use disorder  Left hip pain  Hearing loss, right  Migraine with aura and without status migrainosus, not intractable  Viral  pharyngitis  Other orders -     meloxicam (MOBIC) 15 MG tablet; Take 1 tablet (15 mg total) by mouth daily.   I am having Steven Woodard maintain his Ibuprofen, butalbital-acetaminophen-caffeine, varenicline, and meloxicam.  Meds ordered this encounter  Medications  . meloxicam (MOBIC) 15 MG tablet    Sig: Take 1 tablet (15 mg total) by mouth daily.    Dispense:  30 tablet    Refill:  1    Order Specific Question:  Supervising Provider    Answer:  Crecencio Mc [[id number]]     Follow-up: Return in about 2 months (around 11/14/2015) for Follow up.

## 2015-09-24 DIAGNOSIS — J029 Acute pharyngitis, unspecified: Secondary | ICD-10-CM | POA: Insufficient documentation

## 2015-09-24 NOTE — Assessment & Plan Note (Signed)
Meloxicam was very helpful Continue PRN and stretches

## 2015-09-24 NOTE — Assessment & Plan Note (Signed)
Improved with mobic and fioricet prn  Took 1 and had success

## 2015-09-24 NOTE — Assessment & Plan Note (Signed)
Appointment in [place] 2 weeks for hearing loss evaluation Not worsening  Will follow

## 2015-09-24 NOTE — Assessment & Plan Note (Signed)
Stable currently Likely viral in nature Gave handout with home care FU prn worsening/failure to improve.

## 2015-09-24 NOTE — Assessment & Plan Note (Addendum)
Congratulated on effort Continuing pack of Chantix script given Denies side effects FU prn worsening/failure to improve.

## 2015-09-25 ENCOUNTER — Ambulatory Visit: Payer: BLUE CROSS/BLUE SHIELD | Admitting: Audiology

## 2015-09-28 ENCOUNTER — Ambulatory Visit: Payer: BLUE CROSS/BLUE SHIELD | Admitting: Audiology

## 2015-11-16 ENCOUNTER — Ambulatory Visit: Payer: BLUE CROSS/BLUE SHIELD | Admitting: Nurse Practitioner

## 2015-11-17 ENCOUNTER — Ambulatory Visit (INDEPENDENT_AMBULATORY_CARE_PROVIDER_SITE_OTHER): Payer: BLUE CROSS/BLUE SHIELD | Admitting: Family Medicine

## 2015-11-17 ENCOUNTER — Encounter: Payer: Self-pay | Admitting: Family Medicine

## 2015-11-17 VITALS — BP 162/108 | HR 78 | Temp 97.7°F | Ht 67.0 in | Wt 221.1 lb

## 2015-11-17 DIAGNOSIS — G43109 Migraine with aura, not intractable, without status migrainosus: Secondary | ICD-10-CM

## 2015-11-17 DIAGNOSIS — M25552 Pain in left hip: Secondary | ICD-10-CM

## 2015-11-17 DIAGNOSIS — I10 Essential (primary) hypertension: Secondary | ICD-10-CM | POA: Diagnosis not present

## 2015-11-17 DIAGNOSIS — F17200 Nicotine dependence, unspecified, uncomplicated: Secondary | ICD-10-CM | POA: Diagnosis not present

## 2015-11-17 MED ORDER — VARENICLINE TARTRATE 1 MG PO TABS: 1.0000 mg | ORAL_TABLET | Freq: Two times a day (BID) | ORAL | Status: DC

## 2015-11-17 MED ORDER — VARENICLINE TARTRATE 0.5 MG X 11 & 1 MG X 42 PO MISC: ORAL | Status: DC

## 2015-11-17 NOTE — Patient Instructions (Addendum)
It was nice to see you today.  Use the meloxicam only as needed.  Use the fioricet as needed.  Start the chantix.  Keep a log of your home BP's. Follow up next week for a BP check.  Follow up:  Return in about 1 week (around 11/24/2015) for BP check - Nurse visit.  Take care  Dr. Lacinda Axon

## 2015-11-17 NOTE — Progress Notes (Signed)
Pre visit review using our clinic review tool, if applicable. No additional management support is needed unless otherwise documented below in the visit note. 

## 2015-11-17 NOTE — Assessment & Plan Note (Signed)
BP elevated today and on repeat. He appears to have intermittent trouble with elevated blood pressures but he has not been on medication for this. Will have him check pressures at home and have him return next week for a BP check. If elevated at that time will start medication.

## 2015-11-17 NOTE — Assessment & Plan Note (Signed)
Established problem, improved. No recent migraines. Continue PRN Fioricet.

## 2015-11-17 NOTE — Assessment & Plan Note (Signed)
Established problem, stable. Doing well on meloxicam. Advised PRN use instead of daily.

## 2015-11-17 NOTE — Progress Notes (Signed)
Subjective:  Patient ID: Steven Woodard, male    DOB: 09-07-1975  Age: 40 y.o. MRN: [id number]  CC: follow up - Elevated BP, Migraine, Hip pain  HPI:  40 year old male presents for follow-up.  Hip pain  Well-controlled on meloxicam.  Appears to be MSK in etiology.  Migraine  Patient doing well at this time.  He has not had to use his fioricet recently.  No recent migraine.  Tobacco abuse  Patient quit previously for approximately 6 weeks while on Chantix.  He was unable to get the continuing dose/maintenance therapy of Chantix.  He would like to restart today.  HTN  Has been stable/well-controlled off medication.  BP markedly elevated today.  Will discuss today.  Social Hx   Social History   Social History  . Marital Status: Married    Spouse Name: N/A  . Number of Children: N/A  . Years of Education: N/A   Social History Main Topics  . Smoking status: Current Every Day Smoker -- 0.50 packs/day    Types: Cigarettes  . Smokeless tobacco: None     Comment: Chantix   . Alcohol Use: No  . Drug Use: No  . Sexual Activity:    Partners: Female   Other Topics Concern  . None   Social History Narrative   1 cup of coffee, 1 soda, 1 tea at night    Married   GED highest education   Employed as a Dealer    Review of Systems  Constitutional: Negative.   Musculoskeletal:       Hip pain.   Objective:  BP 162/108 mmHg  Pulse 78  Temp(Src) 97.7 F (36.5 C) (Oral)  Ht 5\' 7"  (1.702 m)  Wt 221 lb 2 oz (100.302 kg)  BMI 34.63 kg/m2  SpO2 96%  BP/Weight 11/17/2015 09/14/2015 [id number]  Systolic BP 0000000 [id number]  Diastolic BP 123XX123 86 70  Wt. (Lbs) 221.13 216 -  BMI 34.63 33.82 -   Physical Exam  Constitutional: He is oriented to person, place, and time. He appears well-developed. No distress.  Cardiovascular: Normal rate and regular rhythm.   Pulmonary/Chest: Effort normal and breath sounds normal. He has no wheezes. He has no rales.    Musculoskeletal:  Right hip Hip: ROM - decreased in flexion secondary to hamstring tightness. Strength Pelvic alignment unremarkable to inspection and palpation. Greater trochanter without tenderness to palpation. No tenderness over piriformis and greater trochanter. No pain with FABER or FADIR. No SI joint tenderness and normal minimal SI movement.   Neurological: He is alert and oriented to person, place, and time.  Psychiatric: He has a normal mood and affect.  Vitals reviewed.  Lab Results  Component Value Date   WBC 9.9 08/14/2015   HGB 15.8 08/14/2015   HCT 47.6 08/14/2015   PLT 260.0 08/14/2015   GLUCOSE 107* 08/14/2015   CHOL 186 08/14/2015   TRIG 245.0* 08/14/2015   HDL 28.20* 08/14/2015   LDLDIRECT 109.0 08/14/2015   ALT 45 08/14/2015   AST 21 08/14/2015   NA 140 08/14/2015   K 4.9 08/14/2015   CL 106 08/14/2015   CREATININE 1.05 08/14/2015   BUN 16 08/14/2015   CO2 28 08/14/2015   TSH 0.96 08/14/2015   HGBA1C 5.8 08/14/2015   Assessment & Plan:   Problem List Items Addressed This Visit    Tobacco use disorder    Patient did well on Chantix. He is back to smoking. He would like to try Chantix again.  Rx given today.      Relevant Medications   varenicline (CHANTIX) 1 MG tablet   varenicline (CHANTIX PAK) 0.5 MG X 11 & 1 MG X 42 tablet   Migraines    Established problem, improved. No recent migraines. Continue PRN Fioricet.      Left hip pain - Primary    Established problem, stable. Doing well on meloxicam. Advised PRN use instead of daily.       Benign essential HTN    BP elevated today and on repeat. He appears to have intermittent trouble with elevated blood pressures but he has not been on medication for this. Will have him check pressures at home and have him return next week for a BP check. If elevated at that time will start medication.         Meds ordered this encounter  Medications  . DISCONTD: varenicline (CHANTIX PAK) 0.5  MG X 11 & 1 MG X 42 tablet    Sig: Take one 0.5 mg tablet by mouth once daily for 3 days, then increase to one 0.5 mg tablet twice daily for 4 days, then increase to one 1 mg tablet twice daily.    Dispense:  53 tablet    Refill:  0  . DISCONTD: varenicline (CHANTIX) 1 MG tablet    Sig: Take 1 tablet (1 mg total) by mouth 2 (two) times daily.    Dispense:  98 tablet    Refill:  0  . varenicline (CHANTIX) 1 MG tablet    Sig: Take 1 tablet (1 mg total) by mouth 2 (two) times daily.    Dispense:  112 tablet    Refill:  0  . varenicline (CHANTIX PAK) 0.5 MG X 11 & 1 MG X 42 tablet    Sig: Take one 0.5 mg tablet by mouth once daily for 3 days, then increase to one 0.5 mg tablet twice daily for 4 days, then increase to one 1 mg tablet twice daily.    Dispense:  53 tablet    Refill:  0   Follow-up: Return in about 1 week (around 11/24/2015) for BP check - Nurse visit.  [place]

## 2015-11-17 NOTE — Assessment & Plan Note (Signed)
Patient did well on Chantix. He is back to smoking. He would like to try Chantix again. Rx given today.

## 2015-12-07 ENCOUNTER — Ambulatory Visit (INDEPENDENT_AMBULATORY_CARE_PROVIDER_SITE_OTHER): Payer: BLUE CROSS/BLUE SHIELD

## 2015-12-07 ENCOUNTER — Telehealth: Payer: Self-pay | Admitting: *Deleted

## 2015-12-07 VITALS — BP 148/96 | HR 83

## 2015-12-07 DIAGNOSIS — I10 Essential (primary) hypertension: Secondary | ICD-10-CM | POA: Diagnosis not present

## 2015-12-07 NOTE — Telephone Encounter (Signed)
Patient was called and explained to that I was calling to make sure he was seen for his nurse visit.

## 2015-12-07 NOTE — Progress Notes (Signed)
BP elevated and warrants treatment. Patient to follow up with PCP regarding this.

## 2015-12-07 NOTE — Telephone Encounter (Signed)
Patient stated that he received a call, he was in for a blood pressure check this morning.  [phone]

## 2015-12-07 NOTE — Progress Notes (Addendum)
Pt was in receiving a BP check today. Pt is not on medication was in for a recheck. BP still elevated from last visit.

## 2015-12-15 ENCOUNTER — Other Ambulatory Visit: Payer: Self-pay | Admitting: Family Medicine

## 2015-12-15 MED ORDER — CHLORTHALIDONE 25 MG PO TABS: 25.0000 mg | ORAL_TABLET | Freq: Every day | ORAL | Status: DC

## 2015-12-15 NOTE — Progress Notes (Signed)
Care was provided under my supervision. I agree with the note. Patient needs to start medication for hypertension.  Thersa Salt DO

## 2016-02-01 ENCOUNTER — Encounter: Payer: Self-pay | Admitting: Family Medicine

## 2016-06-14 ENCOUNTER — Other Ambulatory Visit: Payer: Self-pay | Admitting: Family Medicine

## 2016-06-17 NOTE — Telephone Encounter (Signed)
PT last refill for [place] was on 05/17/16, last labs were on 08/14/15, last OV 11/17/15 with no future scheduled appt. Would you like pt to come in for OV or ok to refill?

## 2016-09-13 ENCOUNTER — Other Ambulatory Visit: Payer: Self-pay | Admitting: Family Medicine

## 2016-09-13 MED ORDER — CHLORTHALIDONE 25 MG PO TABS: ORAL_TABLET | ORAL | 0 refills | Status: DC

## 2016-11-27 ENCOUNTER — Other Ambulatory Visit: Payer: Self-pay | Admitting: Family Medicine

## 2017-02-17 ENCOUNTER — Other Ambulatory Visit: Payer: Self-pay | Admitting: Family Medicine

## 2017-02-17 NOTE — Telephone Encounter (Signed)
Last OV 11/17/2015 Next OV not scheduled Last refill 11/27/2016

## 2017-04-10 IMAGING — CT CT ABD-PELV W/ CM
1 of 2 series · 15 of 32 positions shown, 19 images · IV contrast (omnipaque)
Comparison: None.

CLINICAL DATA: Mid upper abdominal pain for 1-2 weeks with tearing
sensation. Concern for hernia versus gallbladder.

EXAM:
CT ABDOMEN AND PELVIS WITH CONTRAST
TECHNIQUE: Multidetector CT imaging of the abdomen and pelvis was performed
using the standard protocol following bolus administration of
intravenous contrast.
CONTRAST:  100mL OMNIPAQUE IOHEXOL 300 MG/ML  SOLN

[Series 2: routine abd pel with · axial · 0.79mm/px · z∈[-485,-30]mm · 15 of 101 slices shown, 19 images]
[im 5/101  soft-tissue]
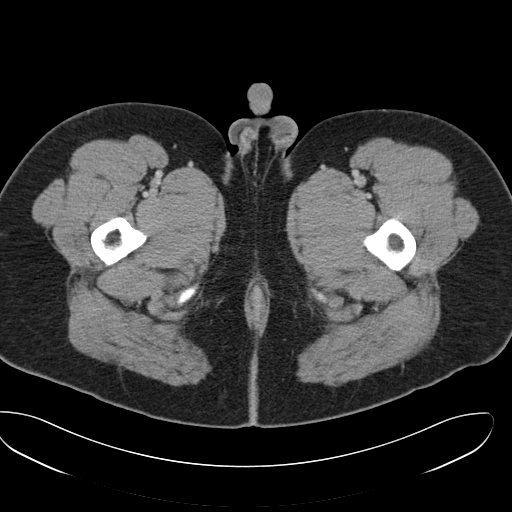
[im 5/101  bone]
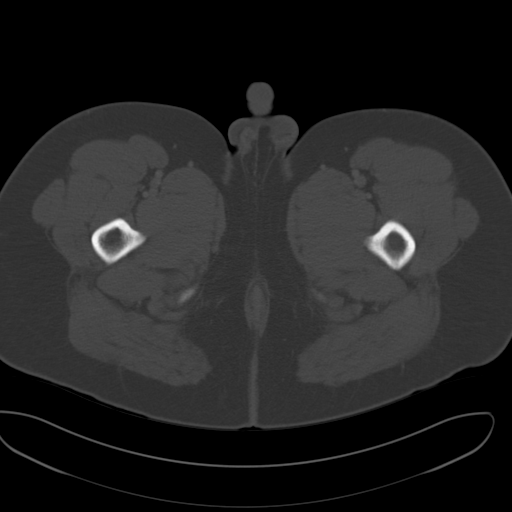
[im 13/101  soft-tissue]
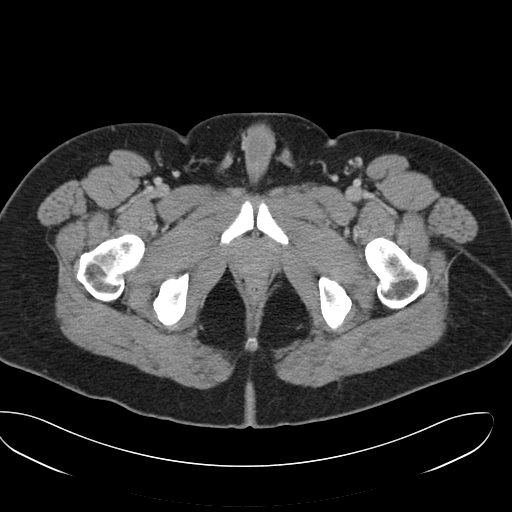
[im 21/101  soft-tissue]
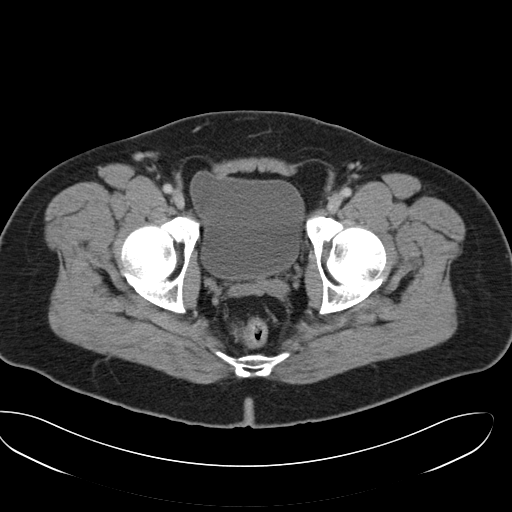
[im 30/101  soft-tissue]
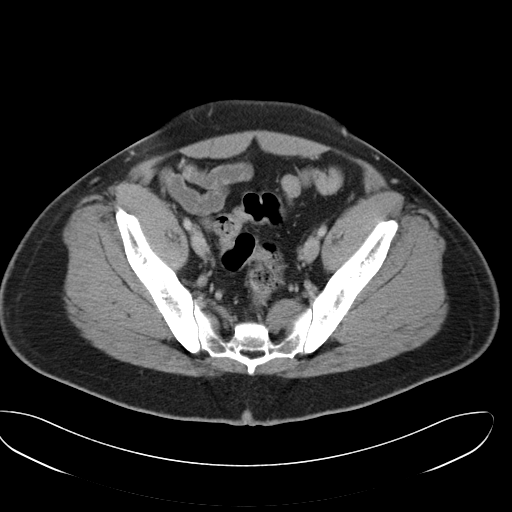
[im 34/101  soft-tissue]
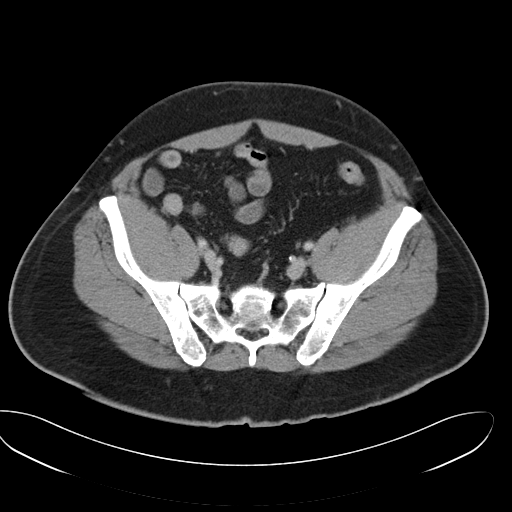
[im 42/101  soft-tissue]
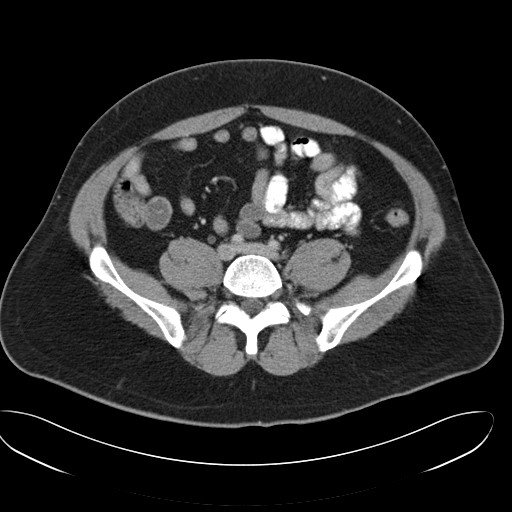
[im 51/101  soft-tissue]
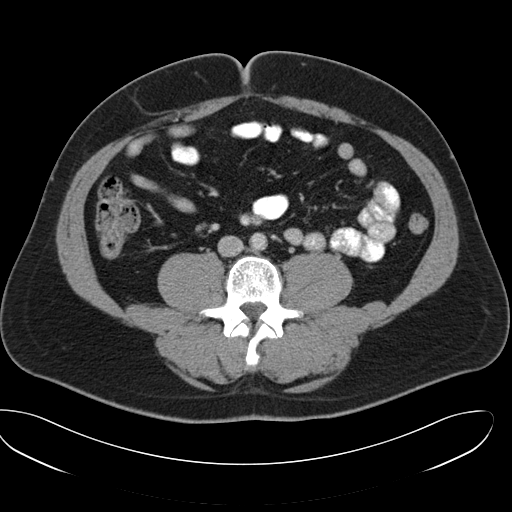
[im 59/101  soft-tissue]
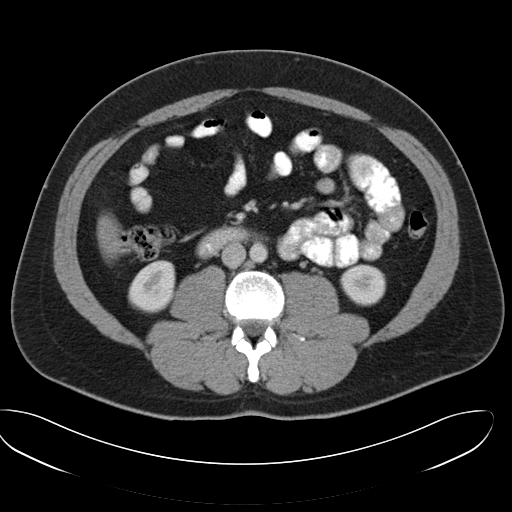
[im 67/101  soft-tissue]
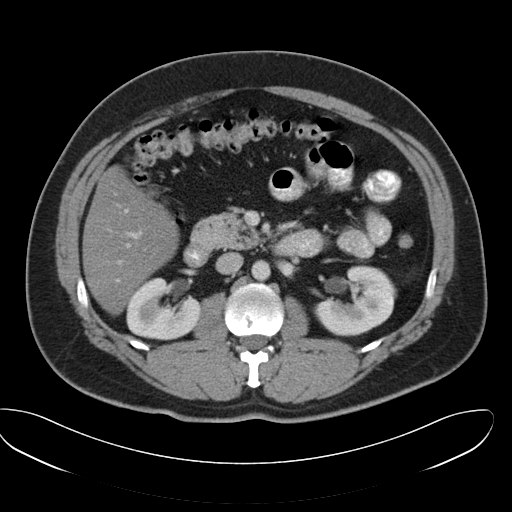
[im 67/101  bone]
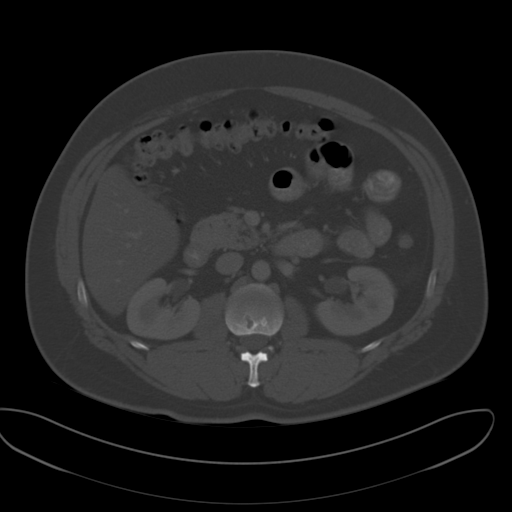
[im 71/101  soft-tissue]
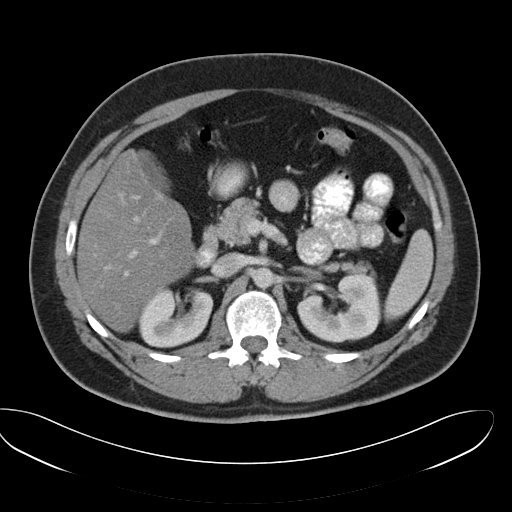
[im 80/101  soft-tissue]
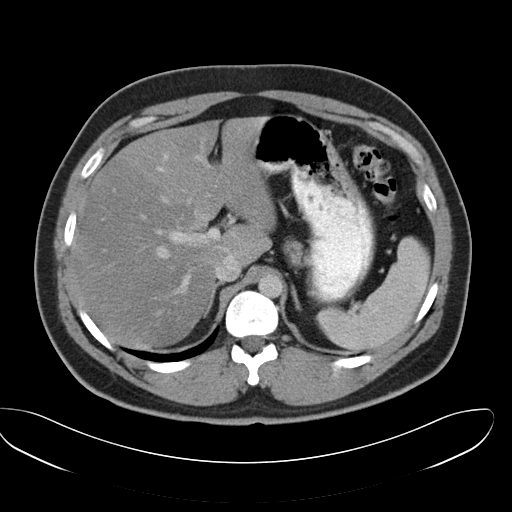
[im 84/101  lung]
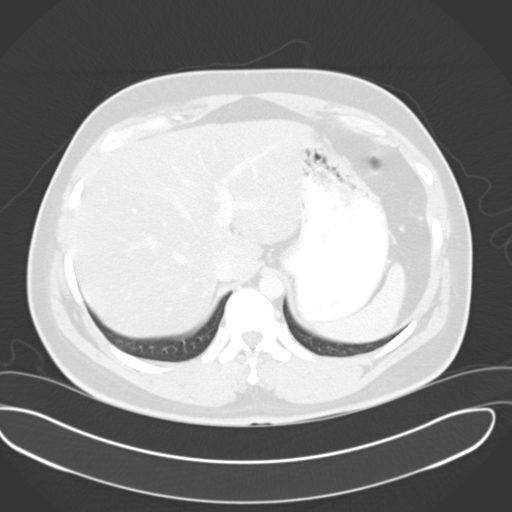
[im 88/101  soft-tissue]
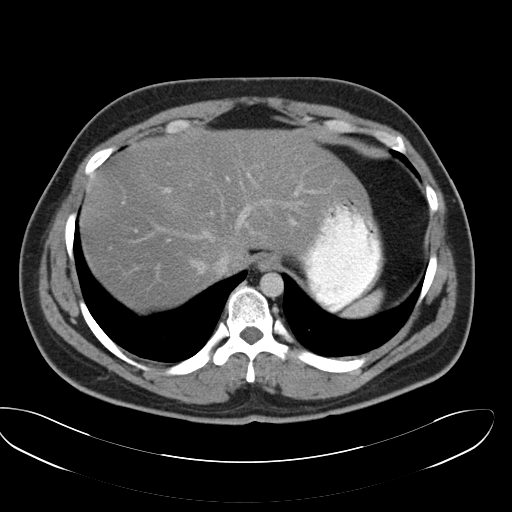
[im 88/101  lung]
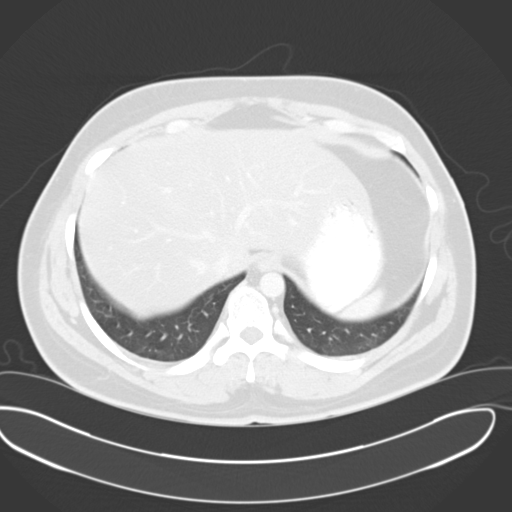
[im 92/101  lung]
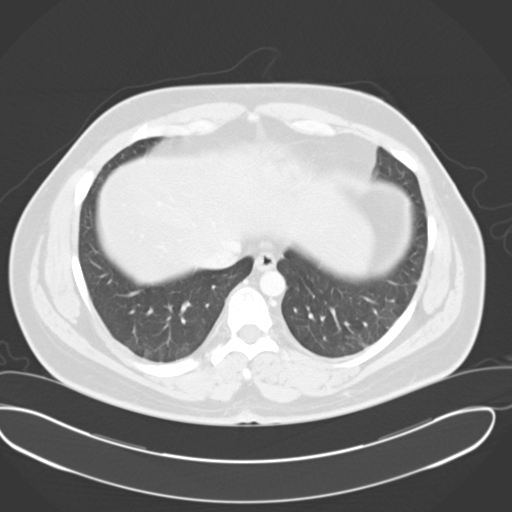
[im 96/101  soft-tissue]
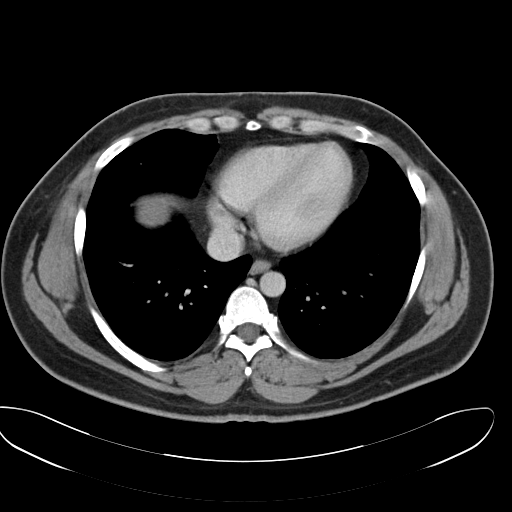
[im 96/101  lung]
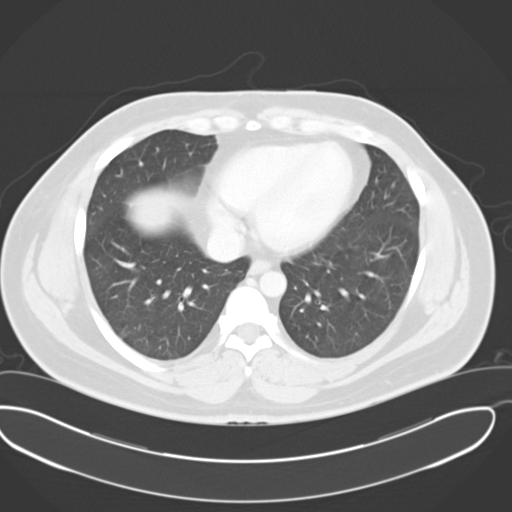

[15 of 32 positions shown; findings below may reference images not displayed]

FINDINGS: Lower chest: No consolidation or effusion. Minimal scattered
geographic ground-glass opacities, suspect smoking related lung
disease.

Liver: Diffusely decreased density consistent with steatosis.

Hepatobiliary: Gallbladder is physiologically distended. No
calcified gallstone. No pericholecystic inflammatory change. There
is no biliary dilatation.

Pancreas: Normal.

Spleen: Normal.

Adrenal glands: No nodule.

Kidneys: Symmetric renal enhancement. No hydronephrosis. No
localizing abnormality.

Stomach/Bowel: Stomach physiologically distended. No hiatal hernia.
There are no dilated or thickened small bowel loops. Small volume of
stool throughout the colon without colonic wall thickening. The
appendix is normal.

Vascular/Lymphatic: No retroperitoneal adenopathy. Abdominal aorta
is normal in caliber.

Reproductive: Prostate gland is normal in size.

Bladder: Physiologically distended.

Other: Fat within both inguinal canals. Diminutive fat containing
umbilical hernia. Ventral abdominal wall otherwise intact. No free
air, free fluid, or intra-abdominal fluid collection.

Musculoskeletal: There are no acute or suspicious osseous
abnormalities. Mild degenerative change in the spine.
IMPRESSION: 1. No acute abnormality in the abdomen/pelvis.
2. Diminutive fat containing umbilical hernia and small fat
containing inguinal hernias.
3. Relatively severe hepatic steatosis. Normal CT appearance of
gallbladder.

## 2017-04-12 ENCOUNTER — Encounter: Payer: Self-pay | Admitting: Emergency Medicine

## 2017-04-12 ENCOUNTER — Emergency Department: Payer: BLUE CROSS/BLUE SHIELD

## 2017-04-12 DIAGNOSIS — F17210 Nicotine dependence, cigarettes, uncomplicated: Secondary | ICD-10-CM | POA: Diagnosis not present

## 2017-04-12 DIAGNOSIS — Z79899 Other long term (current) drug therapy: Secondary | ICD-10-CM | POA: Diagnosis not present

## 2017-04-12 DIAGNOSIS — R51 Headache: Secondary | ICD-10-CM | POA: Diagnosis not present

## 2017-04-12 DIAGNOSIS — R079 Chest pain, unspecified: Secondary | ICD-10-CM | POA: Diagnosis not present

## 2017-04-12 LAB — CBC
HCT: 46.4 % (ref 40.0–52.0)
HEMOGLOBIN: 16 g/dL (ref 13.0–18.0)
MCH: 30.6 pg (ref 26.0–34.0)
MCHC: 34.5 g/dL (ref 32.0–36.0)
MCV: 88.6 fL (ref 80.0–100.0)
Platelets: 266 10*3/uL (ref 150–440)
RBC: 5.23 MIL/uL (ref 4.40–5.90)
RDW: 13.2 % (ref 11.5–14.5)
WBC: 14 10*3/uL — ABNORMAL HIGH (ref 3.8–10.6)

## 2017-04-12 LAB — BASIC METABOLIC PANEL
ANION GAP: 11 (ref 5–15)
BUN: 22 mg/dL — ABNORMAL HIGH (ref 6–20)
CALCIUM: 9.7 mg/dL (ref 8.9–10.3)
CO2: 26 mmol/L (ref 22–32)
Chloride: 104 mmol/L (ref 101–111)
Creatinine, Ser: 1.07 mg/dL (ref 0.61–1.24)
GFR calc Af Amer: >60 mL/min (ref >60)
GFR calc non Af Amer: >60 mL/min (ref >60)
GLUCOSE: 100 mg/dL — AB (ref 65–99)
Potassium: 3.5 mmol/L (ref 3.5–5.1)
Sodium: 141 mmol/L (ref 135–145)

## 2017-04-12 LAB — TROPONIN I

## 2017-04-12 NOTE — ED Triage Notes (Signed)
Pt arrived from home with HA stating he was having accompanying nonradiating left chest pain.  Pt states he feels a little dizzy too.  He takes chlorthiadone but has been out for the last couple of weeks dt a billing issue with insurance.  Pt states that is resolved and the medication is on the way.  Pt in NAD at this time.

## 2017-04-13 ENCOUNTER — Emergency Department: Payer: BLUE CROSS/BLUE SHIELD

## 2017-04-13 ENCOUNTER — Emergency Department
Admission: EM | Admit: 2017-04-13 | Discharge: 2017-04-13 | Disposition: A | Discharge status: Home / Self Care | Payer: BLUE CROSS/BLUE SHIELD | Attending: Emergency Medicine | Admitting: Emergency Medicine

## 2017-04-13 DIAGNOSIS — R079 Chest pain, unspecified: Secondary | ICD-10-CM

## 2017-04-13 DIAGNOSIS — R51 Headache: Secondary | ICD-10-CM

## 2017-04-13 DIAGNOSIS — I10 Essential (primary) hypertension: Secondary | ICD-10-CM

## 2017-04-13 DIAGNOSIS — R519 Headache, unspecified: Secondary | ICD-10-CM

## 2017-04-13 LAB — TROPONIN I: Troponin I: <0.03 ng/mL (ref <0.03)

## 2017-04-13 MED ORDER — CHLORTHALIDONE 25 MG PO TABS: 25.0000 mg | ORAL_TABLET | Freq: Every day | ORAL | 0 refills | Status: DC

## 2017-04-13 MED ORDER — CHLORTHALIDONE 25 MG PO TABS
25.0000 mg | ORAL_TABLET | Freq: Once | ORAL | Status: AC
  Administered 2017-04-13: 25 mg via ORAL
  Filled 2017-04-13: qty 1

## 2017-04-13 MED ORDER — METOCLOPRAMIDE HCL 5 MG/ML IJ SOLN
10.0000 mg | Freq: Once | INTRAMUSCULAR | Status: AC
  Administered 2017-04-13: 10 mg via INTRAVENOUS
  Filled 2017-04-13: qty 2

## 2017-04-13 MED ORDER — SODIUM CHLORIDE 0.9 % IV BOLUS (SEPSIS)
500.0000 mL | Freq: Once | INTRAVENOUS | Status: AC
  Administered 2017-04-13: 500 mL via INTRAVENOUS

## 2017-04-13 MED ORDER — DIPHENHYDRAMINE HCL 50 MG/ML IJ SOLN
25.0000 mg | Freq: Once | INTRAMUSCULAR | Status: AC
  Administered 2017-04-13: 25 mg via INTRAVENOUS
  Filled 2017-04-13: qty 1

## 2017-04-13 MED ORDER — BUTALBITAL-APAP-CAFFEINE 50-325-40 MG PO TABS: 1.0000 | ORAL_TABLET | Freq: Four times a day (QID) | ORAL | 0 refills | Status: AC | PRN

## 2017-04-13 MED ORDER — KETOROLAC TROMETHAMINE 30 MG/ML IJ SOLN
30.0000 mg | Freq: Once | INTRAMUSCULAR | Status: AC
  Administered 2017-04-13: 30 mg via INTRAVENOUS
  Filled 2017-04-13: qty 1

## 2017-04-13 NOTE — Discharge Instructions (Signed)
PLease follow up with your primary care provider

## 2017-04-13 NOTE — ED Notes (Signed)
Reviewed d/c instructions, follow-up care, prescriptions with patient. Pt verbalized understanding.  

## 2017-04-13 NOTE — ED Notes (Signed)
Patient c/o headache X 2 weeks and chest pain X 2 days

## 2017-04-13 NOTE — ED Provider Notes (Signed)
[facility]: [facility] Emergency Department Provider Note   ____________________________________________   First MD Initiated Contact with Patient 04/13/17 0141     (approximate)  I have reviewed the triage vital signs and the nursing notes.   HISTORY  Chief Complaint Chest Pain    HPI Steven Woodard is a 41 y.o. male Who comes into the hospital today with headaches and high blood pressure. He reports that he's been having headaches for the past 2 weeks. He states that he started to have chest pain over the last few days. The patient checked his blood pressure at home and it was 171/117 sig he came in. The patient states that he has been out of his medication for the last week and a half and he has been waiting for it to come in a male. He reports that he called his doctor and was told that it should be here in a few days but it still has not arrived. The patient has been taking ibuprofen, Tylenol and Excedrin for the headache but it has not been helping. The patient states it is headache as 8 out of 10 in intensity currently and his chest pain as a 4-5 out of 10. His left side of his chest and it radiates across to the right side of his chest. The patient has had some dizziness and blurred vision intermittently. He denies any nausea or vomiting. He is here today for evaluation of his symptoms.   Past Medical History:  Diagnosis Date  . Arthritis   . GERD (gastroesophageal reflux disease)   . Hypertension     Patient Active Problem List   Diagnosis Date Noted  . Observed sleep apnea 08/14/2015  . Migraines 08/14/2015  . Tobacco use disorder 08/14/2015  . Hearing loss 08/14/2015  . Left hip pain 07/18/2015  . Benign essential HTN 07/18/2015  . GERD (gastroesophageal reflux disease) 07/18/2015    Past Surgical History:  Procedure Laterality Date  . KNEE ARTHROSCOPY Left     Prior to Admission medications   Medication Sig Start Date End Date Taking?  Authorizing Provider  butalbital-acetaminophen-caffeine (FIORICET, ESGIC) 50-325-40 MG tablet Take 1-2 tablets by mouth every 6 (six) hours as needed for headache. 04/13/17 04/13/18  Loney Hering, MD  chlorthalidone (HYGROTON) 25 MG tablet TAKE 1 TABLET DAILY (CALL OFFICE FOR YEARLY FOLLOW UP ON BLOOD PRESSURE) 02/17/17   Coral Spikes, DO  chlorthalidone (HYGROTON) 25 MG tablet Take 1 tablet (25 mg total) by mouth daily. 04/13/17   Loney Hering, MD  Ibuprofen (RA IBUPROFEN) 200 MG CAPS Take by mouth. Reported on 11/17/2015    [provider]  meloxicam (MOBIC) 15 MG tablet Take 1 tablet (15 mg total) by mouth daily. 09/14/15   Rubbie Battiest, RN  varenicline (CHANTIX PAK) 0.5 MG X 11 & 1 MG X 42 tablet Take one 0.5 mg tablet by mouth once daily for 3 days, then increase to one 0.5 mg tablet twice daily for 4 days, then increase to one 1 mg tablet twice daily. 11/17/15   Coral Spikes, DO  varenicline (CHANTIX) 1 MG tablet Take 1 tablet (1 mg total) by mouth 2 (two) times daily. 11/17/15   Coral Spikes, DO    Allergies Aspirin  Family History  Problem Relation Age of Onset  . Cancer Father        colon  . Stroke Father   . Heart disease Father     Social History Social  History  Substance Use Topics  . Smoking status: Current Every Day Smoker    Packs/day: 0.50    Types: Cigarettes  . Smokeless tobacco: Current User     Comment: Chantix   . Alcohol use No    Review of Systems  Constitutional: No fever/chills Eyes: No visual changes. ENT: No sore throat. Cardiovascular:  chest pain. Respiratory: Denies shortness of breath. Gastrointestinal: No abdominal pain.  No nausea, no vomiting.  No diarrhea.  No constipation. Genitourinary: Negative for dysuria. Musculoskeletal: Negative for back pain. Skin: Negative for rash. Neurological: headache and dizziness   ____________________________________________   PHYSICAL EXAM:  VITAL SIGNS: ED Triage Vitals    Enc Vitals Group     BP 04/12/17 2115 (!) 161/95     Pulse Rate 04/12/17 2115 95     Resp 04/12/17 2115 16     Temp 04/12/17 2115 98.2 F (36.8 C)     Temp Source 04/12/17 2115 Oral     SpO2 04/12/17 2115 99 %     Weight 04/12/17 2118 220 lb (99.8 kg)     Height 04/12/17 2118 5\' 8"  (1.727 m)     Head Circumference --      Peak Flow --      Pain Score 04/12/17 2114 4     Pain Loc --      Pain Edu? --      Excl. in [place]? --     Constitutional: Alert and oriented. Well appearing and in mild distress. Eyes: Conjunctivae are normal. PERRL. EOMI. Head: Atraumatic. Nose: No congestion/rhinnorhea. Mouth/Throat: Mucous membranes are moist.  Oropharynx non-erythematous. Cardiovascular: Normal rate, regular rhythm. Grossly normal heart sounds.  Good peripheral circulation. Respiratory: Normal respiratory effort.  No retractions. Lungs CTAB. Gastrointestinal: Soft and nontender. No distention. positive bowel sounds Musculoskeletal: No lower extremity tenderness nor edema.   Neurologic:  Normal speech and language. cranial nerves II through XII are grossly intact with no focal motor or neuro deficit Skin:  Skin is warm, dry and intact.  Psychiatric: Mood and affect are normal.   ____________________________________________   LABS (all labs ordered are listed, but only abnormal results are displayed)  Labs Reviewed  BASIC METABOLIC PANEL - Abnormal; Notable for the following:       Result Value   Glucose, Bld 100 (*)    BUN 22 (*)    All other components within normal limits  CBC - Abnormal; Notable for the following:    WBC 14.0 (*)    All other components within normal limits  TROPONIN I  TROPONIN I   ____________________________________________  EKG  ED ECG REPORT I, Loney Hering, the attending physician, personally viewed and interpreted this ECG.   Date: 04/12/2017  EKG Time: 2130  Rate: 73  Rhythm: normal sinus rhythm  Axis: normal  Intervals:none  ST&T  Change: none  ____________________________________________  RADIOLOGY  Dg Chest 2 View  Result Date: 04/12/2017 CLINICAL DATA:  Initial evaluation for acute chest pain. EXAM: CHEST  2 VIEW COMPARISON:  Prior radiograph from 11/14/2012. FINDINGS: The cardiac and mediastinal silhouettes are stable in size and contour, and remain within normal limits. The lungs are normally inflated. No airspace consolidation, pleural effusion, or pulmonary edema is identified. There is no pneumothorax. No acute osseous abnormality identified. IMPRESSION: No active cardiopulmonary disease. Electronically Signed   By: Jeannine Boga M.D.   On: 04/12/2017 22:32   Ct Head Wo Contrast  Result Date: 04/13/2017 CLINICAL DATA:  Acute onset of  headache and left-sided chest pain. Mild dizziness. Initial encounter. EXAM: CT HEAD WITHOUT CONTRAST TECHNIQUE: Contiguous axial images were obtained from the base of the skull through the vertex without intravenous contrast. COMPARISON:  None. FINDINGS: Brain: No evidence of acute infarction, hemorrhage, hydrocephalus, extra-axial collection or mass lesion/mass effect. The posterior fossa, including the cerebellum, brainstem and fourth ventricle, is within normal limits. The third and lateral ventricles, and basal ganglia are unremarkable in appearance. The cerebral hemispheres are symmetric in appearance, with normal gray-white differentiation. No mass effect or midline shift is seen. Vascular: No hyperdense vessel or unexpected calcification. Skull: There is no evidence of fracture; visualized osseous structures are unremarkable in appearance. Sinuses/Orbits: The visualized portions of the orbits are within normal limits. The paranasal sinuses and mastoid air cells are well-aerated. Other: No significant soft tissue abnormalities are seen. IMPRESSION: Unremarkable noncontrast CT of the head. Electronically Signed   By: Garald Balding M.D.   On: 04/13/2017 04:03     ____________________________________________   PROCEDURES  Procedure(s) performed: None  Procedures  Critical Care performed: No  ____________________________________________   INITIAL IMPRESSION / ASSESSMENT AND PLAN / ED COURSE  As part of my medical decision making, I reviewed the following data within the electronic MEDICAL RECORD NUMBER Notes from prior ED visits and [place] Controlled Substance Database   This is a 41 year old male who comes into the hospital today with some headaches, high blood pressure and chest pain.  My differential diagnosis includes hypertensive urgency, general headache, acute coronary syndrome  We did check some blood work on the patient which was unremarkable. I sent the patient for a CT scan of his head as well and it was also negative. I did give the patient some Reglan, Benadryl and Toradol as well as a liter of normal saline. I gave the patient a dose of his chlorthalidone. the patient's blood pressure did improve to 139/83. The patient's headache is improved as well as his chest pain. His EKG is unremarkable and his repeat troponin is also negative. The patient will be discharged to follow-up with his primary care physician. I will give the patient a prescription for his chlorthalidone for home.      ____________________________________________   FINAL CLINICAL IMPRESSION(S) / ED DIAGNOSES  Final diagnoses:  Chest pain, unspecified type  Hypertension, unspecified type  Acute nonintractable headache, unspecified headache type      NEW MEDICATIONS STARTED DURING THIS VISIT:  New Prescriptions   BUTALBITAL-ACETAMINOPHEN-CAFFEINE (FIORICET, ESGIC) 50-325-40 MG TABLET    Take 1-2 tablets by mouth every 6 (six) hours as needed for headache.   CHLORTHALIDONE (HYGROTON) 25 MG TABLET    Take 1 tablet (25 mg total) by mouth daily.     Note:  This document was prepared using Dragon voice recognition software and may include unintentional  dictation errors.    Loney Hering, MD 04/13/17 [phone]

## 2017-06-17 ENCOUNTER — Other Ambulatory Visit: Payer: Self-pay | Admitting: Family Medicine

## 2017-08-12 DIAGNOSIS — M5412 Radiculopathy, cervical region: Secondary | ICD-10-CM | POA: Insufficient documentation

## 2020-04-24 ENCOUNTER — Emergency Department (HOSPITAL_COMMUNITY)
Admission: EM | Admit: 2020-04-24 | Discharge: 2020-04-24 | Disposition: A | Discharge status: Home / Self Care | Payer: BC Managed Care – PPO | Attending: Emergency Medicine | Admitting: Emergency Medicine

## 2020-04-24 ENCOUNTER — Other Ambulatory Visit: Payer: Self-pay

## 2020-04-24 ENCOUNTER — Emergency Department (HOSPITAL_COMMUNITY): Payer: BC Managed Care – PPO

## 2020-04-24 ENCOUNTER — Encounter (HOSPITAL_COMMUNITY): Payer: Self-pay | Admitting: Emergency Medicine

## 2020-04-24 DIAGNOSIS — K7689 Other specified diseases of liver: Secondary | ICD-10-CM | POA: Diagnosis not present

## 2020-04-24 DIAGNOSIS — K219 Gastro-esophageal reflux disease without esophagitis: Secondary | ICD-10-CM | POA: Insufficient documentation

## 2020-04-24 DIAGNOSIS — I10 Essential (primary) hypertension: Secondary | ICD-10-CM | POA: Insufficient documentation

## 2020-04-24 DIAGNOSIS — R1013 Epigastric pain: Secondary | ICD-10-CM | POA: Diagnosis not present

## 2020-04-24 DIAGNOSIS — K4090 Unilateral inguinal hernia, without obstruction or gangrene, not specified as recurrent: Secondary | ICD-10-CM | POA: Diagnosis not present

## 2020-04-24 DIAGNOSIS — F17210 Nicotine dependence, cigarettes, uncomplicated: Secondary | ICD-10-CM | POA: Insufficient documentation

## 2020-04-24 LAB — COMPREHENSIVE METABOLIC PANEL
ALT: 32 U/L (ref 0–44)
AST: 37 U/L (ref 15–41)
Albumin: 4.5 g/dL (ref 3.5–5.0)
Alkaline Phosphatase: 56 U/L (ref 38–126)
Anion gap: 18 — ABNORMAL HIGH (ref 5–15)
BUN: 19 mg/dL (ref 6–20)
CO2: 17 mmol/L — ABNORMAL LOW (ref 22–32)
Calcium: 9.1 mg/dL (ref 8.9–10.3)
Chloride: 106 mmol/L (ref 98–111)
Creatinine, Ser: 1.15 mg/dL (ref 0.61–1.24)
GFR, Estimated: >60 mL/min (ref >60)
Glucose, Bld: 94 mg/dL (ref 70–99)
Potassium: 4 mmol/L (ref 3.5–5.1)
Sodium: 141 mmol/L (ref 135–145)
Total Bilirubin: 0.7 mg/dL (ref 0.3–1.2)
Total Protein: 7.4 g/dL (ref 6.5–8.1)

## 2020-04-24 LAB — CBC
HCT: 44.3 % (ref 39.0–52.0)
Hemoglobin: 16.6 g/dL (ref 13.0–17.0)
MCH: 34.4 pg — ABNORMAL HIGH (ref 26.0–34.0)
MCHC: 37.5 g/dL — ABNORMAL HIGH (ref 30.0–36.0)
MCV: 91.7 fL (ref 80.0–100.0)
Platelets: 302 10*3/uL (ref 150–400)
RBC: 4.83 MIL/uL (ref 4.22–5.81)
RDW: 12.5 % (ref 11.5–15.5)
WBC: 11.1 10*3/uL — ABNORMAL HIGH (ref 4.0–10.5)
nRBC: 0 % (ref 0.0–0.2)

## 2020-04-24 LAB — LIPASE, BLOOD: Lipase: 44 U/L (ref 11–51)

## 2020-04-24 LAB — URINALYSIS, ROUTINE W REFLEX MICROSCOPIC
Bilirubin Urine: NEGATIVE
Glucose, UA: NEGATIVE mg/dL
Hgb urine dipstick: NEGATIVE
Ketones, ur: NEGATIVE mg/dL
Leukocytes,Ua: NEGATIVE
Nitrite: NEGATIVE
Protein, ur: NEGATIVE mg/dL
Specific Gravity, Urine: 1.015 (ref 1.005–1.030)
pH: 8 (ref 5.0–8.0)

## 2020-04-24 MED ORDER — PANTOPRAZOLE SODIUM 20 MG PO TBEC: 20.0000 mg | DELAYED_RELEASE_TABLET | Freq: Every day | ORAL | 0 refills | Status: DC

## 2020-04-24 MED ORDER — ONDANSETRON 4 MG PO TBDP: 4.0000 mg | ORAL_TABLET | Freq: Three times a day (TID) | ORAL | 0 refills | Status: DC | PRN

## 2020-04-24 MED ORDER — SODIUM CHLORIDE 0.9 % IV BOLUS
1000.0000 mL | Freq: Once | INTRAVENOUS | Status: AC
  Administered 2020-04-24: 1000 mL via INTRAVENOUS

## 2020-04-24 MED ORDER — ONDANSETRON HCL 4 MG/2ML IJ SOLN
4.0000 mg | Freq: Once | INTRAMUSCULAR | Status: AC
  Administered 2020-04-24: 4 mg via INTRAVENOUS
  Filled 2020-04-24: qty 2

## 2020-04-24 MED ORDER — MORPHINE SULFATE (PF) 4 MG/ML IV SOLN
4.0000 mg | Freq: Once | INTRAVENOUS | Status: AC
  Administered 2020-04-24: 4 mg via INTRAVENOUS
  Filled 2020-04-24: qty 1

## 2020-04-24 MED ORDER — FAMOTIDINE IN NACL 20-0.9 MG/50ML-% IV SOLN
20.0000 mg | Freq: Once | INTRAVENOUS | Status: AC
  Administered 2020-04-24: 20 mg via INTRAVENOUS
  Filled 2020-04-24: qty 50

## 2020-04-24 MED ORDER — IOHEXOL 300 MG/ML  SOLN
100.0000 mL | Freq: Once | INTRAMUSCULAR | Status: AC | PRN
  Administered 2020-04-24: 100 mL via INTRAVENOUS

## 2020-04-24 NOTE — ED Triage Notes (Signed)
Pt c/o abd pain with nausea intermittently since last Monday.

## 2020-04-24 NOTE — ED Provider Notes (Signed)
[facility] EMERGENCY DEPARTMENT Provider Note   CSN: [id number] Arrival date & time: 04/24/20  1922     History Chief Complaint  Patient presents with  . Abdominal Pain    Steven Woodard is a 44 y.o. male.  Pt presents to the ED today with epigastric abdominal pain.  Pt said pain started on 10/18.  It went away over the weekend, but came back worse today.  Pt took Maalox without improvement in sx.  Pt has never had anything like this in the past.  No n/v.  No f/c.        Past Medical History:  Diagnosis Date  . Arthritis   . GERD (gastroesophageal reflux disease)   . Hypertension     Patient Active Problem List   Diagnosis Date Noted  . Observed sleep apnea 08/14/2015  . Migraines 08/14/2015  . Tobacco use disorder 08/14/2015  . Hearing loss 08/14/2015  . Left hip pain 07/18/2015  . Benign essential HTN 07/18/2015  . GERD (gastroesophageal reflux disease) 07/18/2015    Past Surgical History:  Procedure Laterality Date  . KNEE ARTHROSCOPY Left   . neck fusion         Family History  Problem Relation Age of Onset  . Cancer Father        colon  . Stroke Father   . Heart disease Father     Social History   Tobacco Use  . Smoking status: Current Every Day Smoker    Packs/day: 0.50    Types: Cigarettes  . Smokeless tobacco: Current User  . Tobacco comment: Chantix   Substance Use Topics  . Alcohol use: No    Alcohol/week: 0.0 standard drinks  . Drug use: No    Home Medications Prior to Admission medications   Medication Sig Start Date End Date Taking? Authorizing Provider  chlorthalidone (HYGROTON) 25 MG tablet TAKE 1 TABLET DAILY (CALL OFFICE FOR YEARLY FOLLOW UP ON BLOOD PRESSURE) 02/17/17   Coral Spikes, DO  chlorthalidone (HYGROTON) 25 MG tablet Take 1 tablet (25 mg total) by mouth daily. 04/13/17   Loney Hering, MD  Ibuprofen (RA IBUPROFEN) 200 MG CAPS Take by mouth. Reported on 11/17/2015    [provider]  meloxicam (MOBIC)  15 MG tablet Take 1 tablet (15 mg total) by mouth daily. 09/14/15   Rubbie Battiest, RN  ondansetron (ZOFRAN ODT) 4 MG disintegrating tablet Take 1 tablet (4 mg total) by mouth every 8 (eight) hours as needed for nausea or vomiting. 04/24/20   Isla Pence, MD  pantoprazole (PROTONIX) 20 MG tablet Take 1 tablet (20 mg total) by mouth daily. 04/24/20   Isla Pence, MD  varenicline (CHANTIX PAK) 0.5 MG X 11 & 1 MG X 42 tablet Take one 0.5 mg tablet by mouth once daily for 3 days, then increase to one 0.5 mg tablet twice daily for 4 days, then increase to one 1 mg tablet twice daily. 11/17/15   Coral Spikes, DO  varenicline (CHANTIX) 1 MG tablet Take 1 tablet (1 mg total) by mouth 2 (two) times daily. 11/17/15   Coral Spikes, DO    Allergies    Aspirin  Review of Systems   Review of Systems  Gastrointestinal: Positive for abdominal pain.  All other systems reviewed and are negative.   Physical Exam Updated Vital Signs BP (!) 153/79   Pulse 93   Temp 98 F (36.7 C) (Oral)   Resp 18   Ht 5'  8" (1.727 m)   Wt 83.9 kg   SpO2 98%   BMI 28.13 kg/m   Physical Exam Vitals and nursing note reviewed.  Constitutional:      Appearance: He is well-developed.  HENT:     Head: Normocephalic and atraumatic.     Mouth/Throat:     Mouth: Mucous membranes are moist.     Pharynx: Oropharynx is clear.  Eyes:     Extraocular Movements: Extraocular movements intact.     Pupils: Pupils are equal, round, and reactive to light.  Cardiovascular:     Rate and Rhythm: Normal rate and regular rhythm.  Pulmonary:     Effort: Pulmonary effort is normal.     Breath sounds: Normal breath sounds.  Abdominal:     General: Abdomen is flat. Bowel sounds are normal.     Palpations: Abdomen is soft.     Tenderness: There is abdominal tenderness in the epigastric area.  Skin:    General: Skin is warm.     Capillary Refill: Capillary refill takes less than 2 seconds.  Neurological:     General: No  focal deficit present.     Mental Status: He is alert and oriented to person, place, and time.  Psychiatric:        Mood and Affect: Mood normal.        Behavior: Behavior normal.     ED Results / Procedures / Treatments   Labs (all labs ordered are listed, but only abnormal results are displayed) Labs Reviewed  COMPREHENSIVE METABOLIC PANEL - Abnormal; Notable for the following components:      Result Value   CO2 17 (*)    Anion gap 18 (*)    All other components within normal limits  CBC - Abnormal; Notable for the following components:   WBC 11.1 (*)    MCH 34.4 (*)    MCHC 37.5 (*)    All other components within normal limits  URINALYSIS, ROUTINE W REFLEX MICROSCOPIC - Abnormal; Notable for the following components:   APPearance CLOUDY (*)    All other components within normal limits  LIPASE, BLOOD    EKG None  Radiology CT ABDOMEN PELVIS W CONTRAST  Result Date: 04/24/2020 CLINICAL DATA:  Epigastric pain EXAM: CT ABDOMEN AND PELVIS WITH CONTRAST TECHNIQUE: Multidetector CT imaging of the abdomen and pelvis was performed using the standard protocol following bolus administration of intravenous contrast. CONTRAST:  159mL OMNIPAQUE IOHEXOL 300 MG/ML  SOLN COMPARISON:  February 21, 2015 the FINDINGS: Lower chest: The visualized heart size within normal limits. No pericardial fluid/thickening. No hiatal hernia. The visualized portions of the lungs are clear. Hepatobiliary: There is diffuse low density seen throughout the liver parenchyma. No focal hepatic lesion is seen. No intra or extrahepatic biliary ductal dilatation. The main portal vein is patent. No evidence of calcified gallstones, gallbladder wall thickening or biliary dilatation. Pancreas: Unremarkable. No pancreatic ductal dilatation or surrounding inflammatory changes. Spleen: Normal in size without focal abnormality. Adrenals/Urinary Tract: Both adrenal glands appear normal. The kidneys and collecting system appear  normal without evidence of urinary tract calculus or hydronephrosis. Bladder is unremarkable. Stomach/Bowel: The stomach, small bowel, and colon are normal in appearance. No inflammatory changes, wall thickening, or obstructive findings.The appendix is normal. Vascular/Lymphatic: There are no enlarged mesenteric, retroperitoneal, or pelvic lymph nodes. Scattered atherosclerosis is noted. Reproductive: The prostate is unremarkable. Other: Small fat containing inguinal hernias are noted. The right inguinal hernia has a portion of small bowel abutting the  hernia. Musculoskeletal: No acute or significant osseous findings. IMPRESSION: No acute intra-abdominal or pelvic pathology to explain the patient's symptoms. Electronically Signed   By: Prudencio Pair M.D.   On: 04/24/2020 22:59    Procedures Procedures (including critical care time)  Medications Ordered in ED Medications  sodium chloride 0.9 % bolus 1,000 mL (0 mLs Intravenous Stopped 04/24/20 2203)  ondansetron (ZOFRAN) injection 4 mg (4 mg Intravenous Given 04/24/20 2101)  morphine 4 MG/ML injection 4 mg (4 mg Intravenous Given 04/24/20 2101)  famotidine (PEPCID) IVPB 20 mg premix (0 mg Intravenous Stopped 04/24/20 2203)  iohexol (OMNIPAQUE) 300 MG/ML solution 100 mL (100 mLs Intravenous Contrast Given 04/24/20 2237)    ED Course  I have reviewed the triage vital signs and the nursing notes.  Pertinent labs & imaging results that were available during my care of the patient were reviewed by me and considered in my medical decision making (see chart for details).    MDM Rules/Calculators/A&P                          Pt is feeling better.  I suspect an ulcer or gastritis.  Pt will be started on protonix.  He is to f/u with GI.  Return if worse.  Final Clinical Impression(s) / ED Diagnoses Final diagnoses:  Epigastric pain    Rx / DC Orders ED Discharge Orders         Ordered    pantoprazole (PROTONIX) 20 MG tablet  Daily         04/24/20 2312    ondansetron (ZOFRAN ODT) 4 MG disintegrating tablet  Every 8 hours PRN        04/24/20 2312           Isla Pence, MD 04/24/20 2313

## 2020-04-25 ENCOUNTER — Telehealth: Payer: Self-pay

## 2020-04-25 DIAGNOSIS — I10 Essential (primary) hypertension: Secondary | ICD-10-CM | POA: Diagnosis not present

## 2020-04-25 DIAGNOSIS — Z87718 Personal history of other specified (corrected) congenital malformations of genitourinary system: Secondary | ICD-10-CM | POA: Diagnosis not present

## 2020-04-25 DIAGNOSIS — R1013 Epigastric pain: Secondary | ICD-10-CM | POA: Diagnosis not present

## 2020-04-25 DIAGNOSIS — Z0000 Encounter for general adult medical examination without abnormal findings: Secondary | ICD-10-CM | POA: Diagnosis not present

## 2020-04-25 DIAGNOSIS — Z131 Encounter for screening for diabetes mellitus: Secondary | ICD-10-CM | POA: Diagnosis not present

## 2020-04-25 DIAGNOSIS — Z13220 Encounter for screening for lipoid disorders: Secondary | ICD-10-CM | POA: Diagnosis not present

## 2020-04-25 NOTE — Telephone Encounter (Signed)
ER referral. Can we accept him as a new patient?

## 2020-04-25 NOTE — Telephone Encounter (Signed)
Pt was seen at the ED last night. Pt was asked to call this morning to schedule an apt.

## 2020-04-25 NOTE — Telephone Encounter (Signed)
Ok to schedule ov.  

## 2020-04-27 ENCOUNTER — Encounter: Payer: Self-pay | Admitting: Nurse Practitioner

## 2020-04-27 ENCOUNTER — Ambulatory Visit: Payer: BC Managed Care – PPO | Admitting: Nurse Practitioner

## 2020-04-27 ENCOUNTER — Other Ambulatory Visit: Payer: Self-pay

## 2020-04-27 VITALS — BP 137/86 | HR 80 | Temp 97.1°F | Ht 68.0 in | Wt 195.4 lb

## 2020-04-27 DIAGNOSIS — K219 Gastro-esophageal reflux disease without esophagitis: Secondary | ICD-10-CM

## 2020-04-27 DIAGNOSIS — Z800 Family history of malignant neoplasm of digestive organs: Secondary | ICD-10-CM | POA: Insufficient documentation

## 2020-04-27 DIAGNOSIS — R1013 Epigastric pain: Secondary | ICD-10-CM

## 2020-04-27 DIAGNOSIS — R109 Unspecified abdominal pain: Secondary | ICD-10-CM | POA: Insufficient documentation

## 2020-04-27 MED ORDER — PANTOPRAZOLE SODIUM 40 MG PO TBEC
40.0000 mg | DELAYED_RELEASE_TABLET | Freq: Two times a day (BID) | ORAL | 3 refills | Status: DC
Start: 2020-04-27 — End: 2020-08-14

## 2020-04-27 NOTE — Patient Instructions (Addendum)
Your health issues we discussed today were:   GERD (reflux/heartburn) with abdominal pain in the upper mid abdomen: 1. As we discussed, I sent a new prescription to your pharmacy to increase Protonix to 40 mg twice a day 2. Take this pursing in the morning and 30 minutes for your last meal the day 3. I am printing some information about diet restrictions to help prevent worsening reflux symptoms 4. Call us if you have any worsening or severe symptoms 5. As discussed, we will plan for an upper endoscopy to further evaluate for other causes of your sudden onset abdominal pain 6. This will also allow [place] to screen for "Barrett's esophagus" which is recommended in Caucasian males over age 37 7. Further recommendations will follow  Family history of colon cancer: 1. As discussed, when you follow-up at your next office visit we will schedule a colonoscopy 2. You are technically high risk for colon cancer because of your father having colon cancer at age 68 3. You will likely never go longer than 5 years in between colonoscopies 4. Call us if you have any concerning symptoms such as blood in your stool, changes in bowel movements, or sudden unexpected weight loss  Overall I recommend:  1. Continue other current medications 2. Return for follow-up in 2 months 3. Call us for any questions or concerns   At [facility] we value your feedback. You may receive a survey about your visit today. Please share your experience as we strive to create trusting relationships with our patients to provide genuine, compassionate, quality care.  We appreciate your understanding and patience as we review any laboratory studies, imaging, and other diagnostic tests that are ordered as we care for you. Our office policy is 5 business days for review of these results, and any emergent or urgent results are addressed in a timely manner for your best interest. If you do not hear from our office in 1 week,  please contact us.   We also encourage the use of MyChart, which contains your medical information for your review as well. If you are not enrolled in this feature, an access code is on this after visit summary for your convenience. Thank you for allowing [place] to be involved in your care.  It was great to see you today!  I hope you have a great Fall!!        Food Choices for Gastroesophageal Reflux Disease, Adult When you have gastroesophageal reflux disease (GERD), the foods you eat and your eating habits are very important. Choosing the right foods can help ease the discomfort of GERD. Consider working with a diet and nutrition specialist (dietitian) to help you make healthy food choices. What general guidelines should I follow?  Eating plan  Choose healthy foods low in fat, such as fruits, vegetables, whole grains, low-fat dairy products, and lean meat, fish, and poultry.  Eat frequent, small meals instead of three large meals each day. Eat your meals slowly, in a relaxed setting. Avoid bending over or lying down until 2-3 hours after eating.  Limit high-fat foods such as fatty meats or fried foods.  Limit your intake of oils, butter, and shortening to less than 8 teaspoons each day.  Avoid the following: ? Foods that cause symptoms. These may be different for different people. Keep a food diary to keep track of foods that cause symptoms. ? Alcohol. ? Drinking large amounts of liquid with meals. ? Eating meals during the 2-3 hours before bed.  Cook foods using methods other than frying. This may include baking, grilling, or broiling. Lifestyle  Maintain a healthy weight. Ask your health care provider what weight is healthy for you. If you need to lose weight, work with your health care provider to do so safely.  Exercise for at least 30 minutes on 5 or more days each week, or as told by your health care provider.  Avoid wearing clothes that fit tightly around your waist and  chest.  Do not use any products that contain nicotine or tobacco, such as cigarettes and e-cigarettes. If you need help quitting, ask your health care provider.  Sleep with the head of your bed raised. Use a wedge under the mattress or blocks under the bed frame to raise the head of the bed. What foods are not recommended? The items listed may not be a complete list. Talk with your dietitian about what dietary choices are best for you. Grains Pastries or quick breads with added fat. [place] toast. Vegetables Deep fried vegetables. [place] fries. Any vegetables prepared with added fat. Any vegetables that cause symptoms. For some people this may include tomatoes and tomato products, chili peppers, onions and garlic, and horseradish. Fruits Any fruits prepared with added fat. Any fruits that cause symptoms. For some people this may include citrus fruits, such as oranges, grapefruit, pineapple, and lemons. Meats and other protein foods High-fat meats, such as fatty beef or pork, hot dogs, ribs, ham, sausage, salami and bacon. Fried meat or protein, including fried fish and fried chicken. Nuts and nut butters. Dairy Whole milk and chocolate milk. Sour cream. Cream. Ice cream. Cream cheese. Milk shakes. Beverages Coffee and tea, with or without caffeine. Carbonated beverages. Sodas. Energy drinks. Fruit juice made with acidic fruits (such as orange or grapefruit). Tomato juice. Alcoholic drinks. Fats and oils Butter. Margarine. Shortening. Ghee. Sweets and desserts Chocolate and cocoa. Donuts. Seasoning and other foods Pepper. Peppermint and spearmint. Any condiments, herbs, or seasonings that cause symptoms. For some people, this may include curry, hot sauce, or vinegar-based salad dressings. Summary  When you have gastroesophageal reflux disease (GERD), food and lifestyle choices are very important to help ease the discomfort of GERD.  Eat frequent, small meals instead of three large meals  each day. Eat your meals slowly, in a relaxed setting. Avoid bending over or lying down until 2-3 hours after eating.  Limit high-fat foods such as fatty meat or fried foods. This information is not intended to replace advice given to you by your health care provider. Make sure you discuss any questions you have with your health care provider. Document Revised: 10/08/2018 Document Reviewed: 06/18/2016 Elsevier Patient Education  [place].

## 2020-04-27 NOTE — Progress Notes (Signed)
Primary Care Physician:  Peggyann Shoals Primary Gastroenterologist:  Dr.   Laurel Dimmer Complaint  Patient presents with  . Abdominal Pain    epigastric x monday, comes/goes, can be stabbing-dull pain  . Nausea    no vomiting    HPI:   Steven Woodard is a 44 y.o. male who presents on referral from the emergency department.  The patient was in the ED 04/24/2020 for epigastric pain.  Presented with complaints of epigastric abdominal pain that started on 10/18, went away over the weekend, but came back even worse.  Maalox without improvement.  No nausea or vomiting, fever or chills.  Has never had symptoms like this before.  Does have a past medical history of GERD.  Noted abdominal tenderness in the epigastric area.  Labs essentially unremarkable other than mild leukocytosis with a white blood cell count of 11.1.  The patient was given Zofran, morphine, Pepcid.  CT of the abdomen and pelvis completed found no acute intra-abdominal or pelvic pathology to explain symptoms.  The patient improved after receiving medications in the emergency department and he was discharged on Zofran and Protonix.  Overall ED provider felt gastritis versus possible septic ulcer disease.  Recommended follow-up with GI.  Return precautions were given.  No history of colonoscopy or endoscopy found in our system.  Today he states doing okay overall. Has persistent epigastric abdominal pain that is intermittent, described as dull to stabbing. Also with associated nausea but no vomiting. He is a caucasian male > 44 years old, has never had EGD for Barrett's screening. Pain occurs several times a day "spends more times up than down." Is on Protonix and Famotidine. Any intake (water, food, etc) causes worsening pain. No dysphagia. Denies hematochezia. Thinks he had one episode of dark stools last week, but none since. Denies fever, chills, unintentional weight loss. Has never had a colonoscopy before. Denies URI or  flu-like symptoms. Denies loss of sense of taste or smell. The patient has not received COVID-19 vaccination(s). They are not interested in vaccine scheduling information. Denies chest pain, dyspnea, dizziness, lightheadedness, syncope, near syncope. Denies any other upper or lower GI symptoms.  States when he was 4-5 he overdosed on aspirin. Told to never take uncoated ASA again because it wouldn't be good for his stomach.  NSAIDs: Medication list include meloxicam 15 mg daily and ibuprofen 200 mg as needed. States he hasn't had either of these in at least 2-3 months. Denies ASA powders.  Past Medical History:  Diagnosis Date  . Arthritis   . GERD (gastroesophageal reflux disease)   . Hypertension     Past Surgical History:  Procedure Laterality Date  . KNEE ARTHROSCOPY Left   . neck fusion      Current Outpatient Medications  Medication Sig Dispense Refill  . amLODipine (NORVASC) 10 MG tablet Take 10 mg by mouth daily.    . cholecalciferol (VITAMIN D3) 25 MCG (1000 UNIT) tablet Take 1,000 Units by mouth daily.    . famotidine (PEPCID) 20 MG tablet Take 20 mg by mouth 2 (two) times daily as needed for heartburn or indigestion.    . gabapentin (NEURONTIN) 300 MG capsule Take 300 mg by mouth 3 (three) times daily. As needed    . Omega-3 Fatty Acids (FISH OIL) 1000 MG CAPS Take by mouth daily.    . pantoprazole (PROTONIX) 20 MG tablet Take 1 tablet (20 mg total) by mouth daily. (Patient taking differently: Take 40 mg by mouth  daily. ) 30 tablet 0   No current facility-administered medications for this visit.    Allergies as of 04/27/2020 - Review Complete 04/27/2020  Allergen Reaction Noted  . Aspirin  02/21/2015    Family History  Problem Relation Age of Onset  . Stroke Father   . Heart disease Father   . Colon cancer Father 75       From what he remembers, was a young child  . Gastric cancer Neg Hx   . Esophageal cancer Neg Hx     Social History   Socioeconomic History   . Marital status: Married    Spouse name: Not on file  . Number of children: Not on file  . Years of education: Not on file  . Highest education level: Not on file  Occupational History  . Not on file  Tobacco Use  . Smoking status: Current Every Day Smoker    Packs/day: 0.50    Years: 28.00    Pack years: 14.00    Types: Cigarettes  . Smokeless tobacco: Never Used  . Tobacco comment: vapes also  Substance and Sexual Activity  . Alcohol use: No    Alcohol/week: 0.0 standard drinks  . Drug use: No  . Sexual activity: Yes    Partners: Female  Other Topics Concern  . Not on file  Social History Narrative   1 cup of coffee, 1 soda, 1 tea at night    Married   GED highest education   Employed as a Personal assistant Strain:   . Difficulty of Paying Living Expenses: Not on file  Food Insecurity:   . Worried About Charity fundraiser in the Last Year: Not on file  . Ran Out of Food in the Last Year: Not on file  Transportation Needs:   . Lack of Transportation (Medical): Not on file  . Lack of Transportation (Non-Medical): Not on file  Physical Activity:   . Days of Exercise per Week: Not on file  . Minutes of Exercise per Session: Not on file  Stress:   . Feeling of Stress : Not on file  Social Connections:   . Frequency of Communication with Friends and Family: Not on file  . Frequency of Social Gatherings with Friends and Family: Not on file  . Attends Religious Services: Not on file  . Active Member of Clubs or Organizations: Not on file  . Attends Archivist Meetings: Not on file  . Marital Status: Not on file  Intimate Partner Violence:   . Fear of Current or Ex-Partner: Not on file  . Emotionally Abused: Not on file  . Physically Abused: Not on file  . Sexually Abused: Not on file    Subjective: Review of Systems  Constitutional: Negative for chills, fever, malaise/fatigue and weight loss.  HENT:  Negative for congestion and sore throat.   Respiratory: Negative for cough and shortness of breath.   Cardiovascular: Negative for chest pain and palpitations.  Gastrointestinal: Positive for abdominal pain, heartburn and nausea. Negative for blood in stool, constipation, diarrhea, melena and vomiting.  Musculoskeletal: Negative for joint pain and myalgias.  Skin: Negative for rash.  Neurological: Negative for dizziness and weakness.  Endo/Heme/Allergies: Does not bruise/bleed easily.  Psychiatric/Behavioral: Negative for depression. The patient is not nervous/anxious.   All other systems reviewed and are negative.      Objective: BP 137/86   Pulse 80   Temp Marland Kitchen)  97.1 F (36.2 C)   Ht _0  (1.727 m)   Wt 195 lb 6.4 oz (88.6 kg)   BMI 29.71 kg/m  Physical Exam Vitals and nursing note reviewed.  Constitutional:      General: He is not in acute distress.    Appearance: Normal appearance. He is well-developed and normal weight. He is not ill-appearing, toxic-appearing or diaphoretic.  HENT:     Head: Normocephalic and atraumatic.     Nose: No congestion or rhinorrhea.  Eyes:     General: No scleral icterus. Cardiovascular:     Rate and Rhythm: Normal rate and regular rhythm.     Heart sounds: Normal heart sounds.  Pulmonary:     Effort: Pulmonary effort is normal.     Breath sounds: Normal breath sounds.  Abdominal:     General: Abdomen is flat. Bowel sounds are normal. There is no distension.     Palpations: Abdomen is soft. There is no hepatomegaly, splenomegaly or mass.     Tenderness: There is abdominal tenderness in the epigastric area and left upper quadrant. There is no guarding or rebound.     Hernia: No hernia is present.    Musculoskeletal:     Cervical back: Neck supple.  Skin:    General: Skin is warm and dry.     Coloration: Skin is not jaundiced.     Findings: No bruising or rash.  Neurological:     General: No focal deficit present.     Mental Status:  He is alert and oriented to person, place, and time. Mental status is at baseline.  Psychiatric:        Mood and Affect: Mood normal.        Behavior: Behavior normal.        Thought Content: Thought content normal.      Assessment:  Very pleasant 44 year old male who presents for epigastric abdominal pain after ER visit for presumed gastritis versus peptic ulcer disease.  He also has a family history of colorectal cancer in his father who was diagnosed approximately age 74.  Epigastric pain/GERD: He was initially placed on Protonix 20 mg once daily.  His primary care increase this to 40 mg once daily.  I am going to increase this to 40 mg twice daily because he is still symptomatic, although he has only been on PPI for 2 to 3 days.  He was previously on NSAIDs including Mobic or ibuprofen, but none in about 2 months.  He is quite tender on exam.  Hopefully his increase PPI will help.  He could not afford Zofran as it was approximately $100, but the nausea does not seem severe.  No vomiting.  We will plan for endoscopic evaluation for possible H. pylori, peptic ulcer disease, significant gastritis or esophagitis.  This will also allow screen for Barrett's.  Screening for Barrett's esophagus: He is a Caucasian male over age 60 and has never had an EGD.  He would benefit from Barrett's screening, especially given chronic history of GERD and current symptoms.  Family history of colorectal cancer: Father diagnosed at age 3.  He is currently due for risk based screening.  Because of his significant upper GI symptoms we will plan for the EGD first.  When he follows up in 2 months we will arrange for first ever screening colonoscopy.  No red flag/warning signs or symptoms related to lower GI.  Proceed with EGD with Dr. Abbey Chatters on propofol/MAC in near future: the risks, benefits,  and alternatives have been discussed with the patient in detail. The patient states understanding and desires to proceed. The  patient is not on any anticoagulants, anxiolytics, chronic pain medications, antidepressants, antidiabetics, or iron supplements.  ASA II   Plan: 1. Increase Protonix to 40 mg twice daily.  An updated prescription was sent to his pharmacy 2. Continue other current medications 3. Can use Pepcid in the evenings if needed 4. Plan for upper endoscopy for further evaluation 5. I will provide printed information related to GERD diet 6. Follow-up in 2 months for follow-up and to schedule colonoscopy    Thank you for allowing [place] to participate in the care of [place], DNP, AGNP-C Adult & Gerontological Nurse Practitioner [facility]   04/27/2020 10:46 AM   Disclaimer: This note was dictated with voice recognition software. Similar sounding words can inadvertently be transcribed and may not be corrected upon review.

## 2020-04-27 NOTE — H&P (View-Only) (Signed)
Primary Care Physician:  Peggyann Shoals Primary Gastroenterologist:  Dr.   Laurel Dimmer Complaint  Patient presents with  . Abdominal Pain    epigastric x monday, comes/goes, can be stabbing-dull pain  . Nausea    no vomiting    HPI:   Steven Woodard is a 44 y.o. male who presents on referral from the emergency department.  The patient was in the ED 04/24/2020 for epigastric pain.  Presented with complaints of epigastric abdominal pain that started on 10/18, went away over the weekend, but came back even worse.  Maalox without improvement.  No nausea or vomiting, fever or chills.  Has never had symptoms like this before.  Does have a past medical history of GERD.  Noted abdominal tenderness in the epigastric area.  Labs essentially unremarkable other than mild leukocytosis with a white blood cell count of 11.1.  The patient was given Zofran, morphine, Pepcid.  CT of the abdomen and pelvis completed found no acute intra-abdominal or pelvic pathology to explain symptoms.  The patient improved after receiving medications in the emergency department and he was discharged on Zofran and Protonix.  Overall ED provider felt gastritis versus possible septic ulcer disease.  Recommended follow-up with GI.  Return precautions were given.  No history of colonoscopy or endoscopy found in our system.  Today he states doing okay overall. Has persistent epigastric abdominal pain that is intermittent, described as dull to stabbing. Also with associated nausea but no vomiting. He is a caucasian male > 35 years old, has never had EGD for Barrett's screening. Pain occurs several times a day "spends more times up than down." Is on Protonix and Famotidine. Any intake (water, food, etc) causes worsening pain. No dysphagia. Denies hematochezia. Thinks he had one episode of dark stools last week, but none since. Denies fever, chills, unintentional weight loss. Has never had a colonoscopy before. Denies URI or  flu-like symptoms. Denies loss of sense of taste or smell. The patient has not received COVID-19 vaccination(s). They are not interested in vaccine scheduling information. Denies chest pain, dyspnea, dizziness, lightheadedness, syncope, near syncope. Denies any other upper or lower GI symptoms.  States when he was 4-5 he overdosed on aspirin. Told to never take uncoated ASA again because it wouldn't be good for his stomach.  NSAIDs: Medication list include meloxicam 15 mg daily and ibuprofen 200 mg as needed. States he hasn't had either of these in at least 2-3 months. Denies ASA powders.  Past Medical History:  Diagnosis Date  . Arthritis   . GERD (gastroesophageal reflux disease)   . Hypertension     Past Surgical History:  Procedure Laterality Date  . KNEE ARTHROSCOPY Left   . neck fusion      Current Outpatient Medications  Medication Sig Dispense Refill  . amLODipine (NORVASC) 10 MG tablet Take 10 mg by mouth daily.    . cholecalciferol (VITAMIN D3) 25 MCG (1000 UNIT) tablet Take 1,000 Units by mouth daily.    . famotidine (PEPCID) 20 MG tablet Take 20 mg by mouth 2 (two) times daily as needed for heartburn or indigestion.    . gabapentin (NEURONTIN) 300 MG capsule Take 300 mg by mouth 3 (three) times daily. As needed    . Omega-3 Fatty Acids (FISH OIL) 1000 MG CAPS Take by mouth daily.    . pantoprazole (PROTONIX) 20 MG tablet Take 1 tablet (20 mg total) by mouth daily. (Patient taking differently: Take 40 mg by mouth  daily. ) 30 tablet 0   No current facility-administered medications for this visit.    Allergies as of 04/27/2020 - Review Complete 04/27/2020  Allergen Reaction Noted  . Aspirin  02/21/2015    Family History  Problem Relation Age of Onset  . Stroke Father   . Heart disease Father   . Colon cancer Father 51       From what he remembers, was a young child  . Gastric cancer Neg Hx   . Esophageal cancer Neg Hx     Social History   Socioeconomic History   . Marital status: Married    Spouse name: Not on file  . Number of children: Not on file  . Years of education: Not on file  . Highest education level: Not on file  Occupational History  . Not on file  Tobacco Use  . Smoking status: Current Every Day Smoker    Packs/day: 0.50    Years: 28.00    Pack years: 14.00    Types: Cigarettes  . Smokeless tobacco: Never Used  . Tobacco comment: vapes also  Substance and Sexual Activity  . Alcohol use: No    Alcohol/week: 0.0 standard drinks  . Drug use: No  . Sexual activity: Yes    Partners: Female  Other Topics Concern  . Not on file  Social History Narrative   1 cup of coffee, 1 soda, 1 tea at night    Married   GED highest education   Employed as a Personal assistant Strain:   . Difficulty of Paying Living Expenses: Not on file  Food Insecurity:   . Worried About Charity fundraiser in the Last Year: Not on file  . Ran Out of Food in the Last Year: Not on file  Transportation Needs:   . Lack of Transportation (Medical): Not on file  . Lack of Transportation (Non-Medical): Not on file  Physical Activity:   . Days of Exercise per Week: Not on file  . Minutes of Exercise per Session: Not on file  Stress:   . Feeling of Stress : Not on file  Social Connections:   . Frequency of Communication with Friends and Family: Not on file  . Frequency of Social Gatherings with Friends and Family: Not on file  . Attends Religious Services: Not on file  . Active Member of Clubs or Organizations: Not on file  . Attends Archivist Meetings: Not on file  . Marital Status: Not on file  Intimate Partner Violence:   . Fear of Current or Ex-Partner: Not on file  . Emotionally Abused: Not on file  . Physically Abused: Not on file  . Sexually Abused: Not on file    Subjective: Review of Systems  Constitutional: Negative for chills, fever, malaise/fatigue and weight loss.  HENT:  Negative for congestion and sore throat.   Respiratory: Negative for cough and shortness of breath.   Cardiovascular: Negative for chest pain and palpitations.  Gastrointestinal: Positive for abdominal pain, heartburn and nausea. Negative for blood in stool, constipation, diarrhea, melena and vomiting.  Musculoskeletal: Negative for joint pain and myalgias.  Skin: Negative for rash.  Neurological: Negative for dizziness and weakness.  Endo/Heme/Allergies: Does not bruise/bleed easily.  Psychiatric/Behavioral: Negative for depression. The patient is not nervous/anxious.   All other systems reviewed and are negative.      Objective: BP 137/86   Pulse 80   Temp Marland Kitchen)  97.1 F (36.2 C)   Ht _0  (1.727 m)   Wt 195 lb 6.4 oz (88.6 kg)   BMI 29.71 kg/m  Physical Exam Vitals and nursing note reviewed.  Constitutional:      General: He is not in acute distress.    Appearance: Normal appearance. He is well-developed and normal weight. He is not ill-appearing, toxic-appearing or diaphoretic.  HENT:     Head: Normocephalic and atraumatic.     Nose: No congestion or rhinorrhea.  Eyes:     General: No scleral icterus. Cardiovascular:     Rate and Rhythm: Normal rate and regular rhythm.     Heart sounds: Normal heart sounds.  Pulmonary:     Effort: Pulmonary effort is normal.     Breath sounds: Normal breath sounds.  Abdominal:     General: Abdomen is flat. Bowel sounds are normal. There is no distension.     Palpations: Abdomen is soft. There is no hepatomegaly, splenomegaly or mass.     Tenderness: There is abdominal tenderness in the epigastric area and left upper quadrant. There is no guarding or rebound.     Hernia: No hernia is present.    Musculoskeletal:     Cervical back: Neck supple.  Skin:    General: Skin is warm and dry.     Coloration: Skin is not jaundiced.     Findings: No bruising or rash.  Neurological:     General: No focal deficit present.     Mental Status:  He is alert and oriented to person, place, and time. Mental status is at baseline.  Psychiatric:        Mood and Affect: Mood normal.        Behavior: Behavior normal.        Thought Content: Thought content normal.      Assessment:  Very pleasant 44 year old male who presents for epigastric abdominal pain after ER visit for presumed gastritis versus peptic ulcer disease.  He also has a family history of colorectal cancer in his father who was diagnosed approximately age 33.  Epigastric pain/GERD: He was initially placed on Protonix 20 mg once daily.  His primary care increase this to 40 mg once daily.  I am going to increase this to 40 mg twice daily because he is still symptomatic, although he has only been on PPI for 2 to 3 days.  He was previously on NSAIDs including Mobic or ibuprofen, but none in about 2 months.  He is quite tender on exam.  Hopefully his increase PPI will help.  He could not afford Zofran as it was approximately $100, but the nausea does not seem severe.  No vomiting.  We will plan for endoscopic evaluation for possible H. pylori, peptic ulcer disease, significant gastritis or esophagitis.  This will also allow screen for Barrett's.  Screening for Barrett's esophagus: He is a Caucasian male over age 17 and has never had an EGD.  He would benefit from Barrett's screening, especially given chronic history of GERD and current symptoms.  Family history of colorectal cancer: Father diagnosed at age 64.  He is currently due for risk based screening.  Because of his significant upper GI symptoms we will plan for the EGD first.  When he follows up in 2 months we will arrange for first ever screening colonoscopy.  No red flag/warning signs or symptoms related to lower GI.  Proceed with EGD with Dr. Abbey Chatters on propofol/MAC in near future: the risks, benefits,  and alternatives have been discussed with the patient in detail. The patient states understanding and desires to proceed. The  patient is not on any anticoagulants, anxiolytics, chronic pain medications, antidepressants, antidiabetics, or iron supplements.  ASA II   Plan: 1. Increase Protonix to 40 mg twice daily.  An updated prescription was sent to his pharmacy 2. Continue other current medications 3. Can use Pepcid in the evenings if needed 4. Plan for upper endoscopy for further evaluation 5. I will provide printed information related to GERD diet 6. Follow-up in 2 months for follow-up and to schedule colonoscopy    Thank you for allowing [place] to participate in the care of [place], DNP, AGNP-C Adult & Gerontological Nurse Practitioner [facility]   04/27/2020 10:46 AM   Disclaimer: This note was dictated with voice recognition software. Similar sounding words can inadvertently be transcribed and may not be corrected upon review.

## 2020-05-01 ENCOUNTER — Encounter (HOSPITAL_COMMUNITY): Payer: Self-pay | Admitting: Emergency Medicine

## 2020-05-01 ENCOUNTER — Other Ambulatory Visit: Payer: Self-pay

## 2020-05-01 DIAGNOSIS — F17210 Nicotine dependence, cigarettes, uncomplicated: Secondary | ICD-10-CM | POA: Insufficient documentation

## 2020-05-01 DIAGNOSIS — R110 Nausea: Secondary | ICD-10-CM | POA: Insufficient documentation

## 2020-05-01 DIAGNOSIS — I10 Essential (primary) hypertension: Secondary | ICD-10-CM | POA: Insufficient documentation

## 2020-05-01 DIAGNOSIS — K219 Gastro-esophageal reflux disease without esophagitis: Secondary | ICD-10-CM | POA: Diagnosis not present

## 2020-05-01 DIAGNOSIS — R1013 Epigastric pain: Secondary | ICD-10-CM | POA: Diagnosis not present

## 2020-05-01 DIAGNOSIS — Z79899 Other long term (current) drug therapy: Secondary | ICD-10-CM | POA: Insufficient documentation

## 2020-05-01 LAB — COMPREHENSIVE METABOLIC PANEL
ALT: 23 U/L (ref 0–44)
AST: 18 U/L (ref 15–41)
Albumin: 4.5 g/dL (ref 3.5–5.0)
Alkaline Phosphatase: 56 U/L (ref 38–126)
Anion gap: 6 (ref 5–15)
BUN: 17 mg/dL (ref 6–20)
CO2: 27 mmol/L (ref 22–32)
Calcium: 9.1 mg/dL (ref 8.9–10.3)
Chloride: 103 mmol/L (ref 98–111)
Creatinine, Ser: 1.05 mg/dL (ref 0.61–1.24)
GFR, Estimated: >60 mL/min (ref >60)
Glucose, Bld: 111 mg/dL — ABNORMAL HIGH (ref 70–99)
Potassium: 3.4 mmol/L — ABNORMAL LOW (ref 3.5–5.1)
Sodium: 136 mmol/L (ref 135–145)
Total Bilirubin: 0.4 mg/dL (ref 0.3–1.2)
Total Protein: 7.3 g/dL (ref 6.5–8.1)

## 2020-05-01 LAB — CBC
HCT: 45.3 % (ref 39.0–52.0)
Hemoglobin: 15.1 g/dL (ref 13.0–17.0)
MCH: 30.5 pg (ref 26.0–34.0)
MCHC: 33.3 g/dL (ref 30.0–36.0)
MCV: 91.5 fL (ref 80.0–100.0)
Platelets: 247 10*3/uL (ref 150–400)
RBC: 4.95 MIL/uL (ref 4.22–5.81)
RDW: 12.7 % (ref 11.5–15.5)
WBC: 10.9 10*3/uL — ABNORMAL HIGH (ref 4.0–10.5)
nRBC: 0 % (ref 0.0–0.2)

## 2020-05-01 LAB — LIPASE, BLOOD: Lipase: 46 U/L (ref 11–51)

## 2020-05-01 NOTE — ED Triage Notes (Signed)
Pt c/o abdominal pain since 04/17/20. Was seen here for the same and has an EGD scheduled for 11/5. PCP said to come up here if his pain gets worse.

## 2020-05-02 ENCOUNTER — Emergency Department (HOSPITAL_COMMUNITY)
Admission: EM | Admit: 2020-05-02 | Discharge: 2020-05-02 | Disposition: A | Discharge status: Home / Self Care | Payer: BC Managed Care – PPO | Attending: Emergency Medicine | Admitting: Emergency Medicine

## 2020-05-02 DIAGNOSIS — R1013 Epigastric pain: Secondary | ICD-10-CM

## 2020-05-02 MED ORDER — ONDANSETRON HCL 4 MG/2ML IJ SOLN
4.0000 mg | Freq: Once | INTRAMUSCULAR | Status: AC
  Administered 2020-05-02: 4 mg via INTRAVENOUS
  Filled 2020-05-02: qty 2

## 2020-05-02 MED ORDER — SODIUM CHLORIDE 0.9 % IV BOLUS
1000.0000 mL | Freq: Once | INTRAVENOUS | Status: AC
  Administered 2020-05-02: 1000 mL via INTRAVENOUS

## 2020-05-02 MED ORDER — HYDROCODONE-ACETAMINOPHEN 5-325 MG PO TABS: 1.0000 | ORAL_TABLET | Freq: Four times a day (QID) | ORAL | 0 refills | Status: AC | PRN

## 2020-05-02 MED ORDER — PANTOPRAZOLE SODIUM 40 MG IV SOLR
40.0000 mg | Freq: Once | INTRAVENOUS | Status: AC
  Administered 2020-05-02: 40 mg via INTRAVENOUS
  Filled 2020-05-02: qty 40

## 2020-05-02 MED ORDER — MORPHINE SULFATE (PF) 4 MG/ML IV SOLN
4.0000 mg | Freq: Once | INTRAVENOUS | Status: AC
  Administered 2020-05-02: 4 mg via INTRAVENOUS
  Filled 2020-05-02: qty 1

## 2020-05-02 NOTE — ED Provider Notes (Signed)
Patient CARE signed out to reevaluate after IV fluids and supportive care medications. Patient has procedure planned for Friday for upper endoscopy for concern for ulcer.  Patient is on PPI and compliant. Patient has mild improvement on reassessment, does have persistent symptoms.  Patient concern for possible gallbladder as well. Discussed bedside ultrasound to look for gallstones and outpatient follow-up with gastroenterology.  Ultrasound ED Abd  Date/Time: 05/02/2020 10:34 AM Performed by: Elnora Morrison, MD Authorized by: Elnora Morrison, MD   Procedure details:    Indications: abdominal pain     Assessment for:  Gallstones   Hepatobiliary:  Visualized       Hepatobiliary findings:    Common bile duct:  Normal   Gallbladder wall:  Normal   Gallbladder stones: not identified     Epigastric pain.   Elnora Morrison, MD 05/02/20 [phone]

## 2020-05-02 NOTE — Discharge Instructions (Signed)
You were seen today for abdominal pain.  Your lipase today is normal.  Your previous CT scan did not show pancreatic inflammation.  Pain medication will be added to your regimen.  Follow-up with GI as scheduled and with your primary physician.

## 2020-05-02 NOTE — Telephone Encounter (Signed)
Patient seen and procedure scheduled

## 2020-05-02 NOTE — ED Provider Notes (Signed)
[facility] EMERGENCY DEPARTMENT Provider Note   CSN: [id number] Arrival date & time: 05/01/20  2150     History Chief Complaint  Patient presents with  . Abdominal Pain    Steven Woodard is a 44 y.o. male.  HPI     This is a 44 year old male with a history of arthritis, reflux, hypertension who presents with ongoing abdominal pain.  Patient was seen and evaluated on 10/25 for abdominal pain.  At that time he was diagnosed with presumed peptic ulcer and discharged with Protonix and Zofran.  He is due to have an EGD on Friday.  He has since followed up with his primary physician and reports that he was found to have high lipids.  There was some clinical concern that he may have pancreatitis instead of ulcer or gastritis.  Patient reports that throughout the weekend he had had some interval improvement of pain; however, it worsened acutely.  His current pain level is 8 out of 10.  It is epigastric and nonradiating.  Nothing seems to make it better or worse.  He told to be reevaluated if pain worsened.  He reports nausea without vomiting.  No change in bowel habits.  I have reviewed his chart including a CT scan.  CT scan without any obvious pancreatic inflammation.  Past Medical History:  Diagnosis Date  . Arthritis   . GERD (gastroesophageal reflux disease)   . Hypertension     Patient Active Problem List   Diagnosis Date Noted  . Abdominal pain 04/27/2020  . Family history of colon cancer in father 04/27/2020  . Observed sleep apnea 08/14/2015  . Migraines 08/14/2015  . Tobacco use disorder 08/14/2015  . Hearing loss 08/14/2015  . Left hip pain 07/18/2015  . Benign essential HTN 07/18/2015  . GERD (gastroesophageal reflux disease) 07/18/2015    Past Surgical History:  Procedure Laterality Date  . KNEE ARTHROSCOPY Left   . neck fusion         Family History  Problem Relation Age of Onset  . Stroke Father   . Heart disease Father   . Colon cancer Father 4        From what he remembers, was a young child  . Gastric cancer Neg Hx   . Esophageal cancer Neg Hx     Social History   Tobacco Use  . Smoking status: Current Every Day Smoker    Packs/day: 0.50    Years: 28.00    Pack years: 14.00    Types: Cigarettes  . Smokeless tobacco: Never Used  . Tobacco comment: vapes also  Substance Use Topics  . Alcohol use: No    Alcohol/week: 0.0 standard drinks  . Drug use: No    Home Medications Prior to Admission medications   Medication Sig Start Date End Date Taking? Authorizing Provider  amLODipine (NORVASC) 10 MG tablet Take 10 mg by mouth daily.    [provider]  cholecalciferol (VITAMIN D3) 25 MCG (1000 UNIT) tablet Take 1,000 Units by mouth daily.    [provider]  famotidine (PEPCID) 20 MG tablet Take 20 mg by mouth 2 (two) times daily as needed for heartburn or indigestion.    [provider]  gabapentin (NEURONTIN) 300 MG capsule Take 300 mg by mouth 3 (three) times daily. As needed    [provider]  HYDROcodone-acetaminophen (NORCO/VICODIN) 5-325 MG tablet Take 1 tablet by mouth every 6 (six) hours as needed. 05/02/20   Lakelynn Severtson, Barbette Hair, MD  Omega-3 Fatty Acids (FISH OIL) 1000 MG CAPS Take by mouth daily.    [provider]  pantoprazole (PROTONIX) 40 MG tablet Take 1 tablet (40 mg total) by mouth 2 (two) times daily before a meal. 04/27/20   Carlis Stable, NP    Allergies    Aspirin  Review of Systems   Review of Systems  Constitutional: Negative for fever.  Respiratory: Negative for shortness of breath.   Cardiovascular: Negative for chest pain.  Gastrointestinal: Positive for abdominal pain and nausea. Negative for blood in stool, diarrhea and vomiting.  Genitourinary: Negative for dysuria.  All other systems reviewed and are negative.   Physical Exam Updated Vital Signs BP (!) 149/92 (BP Location: Right Arm)   Pulse 98   Temp 98.2 F (36.8 C) (Oral)   Resp 18   SpO2  100%   Physical Exam Vitals and nursing note reviewed.  Constitutional:      Appearance: He is well-developed.  HENT:     Head: Normocephalic and atraumatic.  Eyes:     Pupils: Pupils are equal, round, and reactive to light.  Cardiovascular:     Rate and Rhythm: Normal rate and regular rhythm.     Heart sounds: Normal heart sounds. No murmur heard.   Pulmonary:     Effort: Pulmonary effort is normal. No respiratory distress.     Breath sounds: Normal breath sounds. No wheezing.  Abdominal:     General: Bowel sounds are normal.     Palpations: Abdomen is soft.     Tenderness: There is abdominal tenderness in the epigastric area. There is no guarding or rebound.  Musculoskeletal:     Cervical back: Neck supple.  Lymphadenopathy:     Cervical: No cervical adenopathy.  Skin:    General: Skin is warm and dry.  Neurological:     Mental Status: He is alert and oriented to person, place, and time.  Psychiatric:        Mood and Affect: Mood normal.     ED Results / Procedures / Treatments   Labs (all labs ordered are listed, but only abnormal results are displayed) Labs Reviewed  COMPREHENSIVE METABOLIC PANEL - Abnormal; Notable for the following components:      Result Value   Potassium 3.4 (*)    Glucose, Bld 111 (*)    All other components within normal limits  CBC - Abnormal; Notable for the following components:   WBC 10.9 (*)    All other components within normal limits  LIPASE, BLOOD    EKG None  Radiology No results found.  Procedures Procedures (including critical care time)  Medications Ordered in ED Medications  morphine 4 MG/ML injection 4 mg (4 mg Intravenous Given 05/02/20 0628)  ondansetron (ZOFRAN) injection 4 mg (4 mg Intravenous Given 05/02/20 0627)  pantoprazole (PROTONIX) injection 40 mg (40 mg Intravenous Given 05/02/20 3710)  sodium chloride 0.9 % bolus 1,000 mL (1,000 mLs Intravenous New Bag/Given 05/02/20 6269)    ED Course  I have  reviewed the triage vital signs and the nursing notes.  Pertinent labs & imaging results that were available during my care of the patient were reviewed by me and considered in my medical decision making (see chart for details).    MDM Rules/Calculators/A&P                          Patient presents with abdominal pain.  Ongoing epigastric pain.  He is  nontoxic and vital signs are reassuring.  Per patient report, lipids at his primary office were greater than 1000.  His primary doctor was concerned for possible pancreatitis.  He has had recurrent and worsening pain over the weekend.  No other risk factors for pancreatitis.  I have reviewed his CT scan which did not show any pancreatic inflammation.  He also has a repeat normal lipase here.  Patient given pain and nausea medication as well as fluids.  I discussed with him that if we can get him symptomatically controlled, I still felt that outpatient evaluation with endoscopy and ongoing management was appropriate.  He is agreeable to plan.  We will plan to discharge with hydrocodone if symptomatically improved after hydration and medications here.  Final Clinical Impression(s) / ED Diagnoses Final diagnoses:  Epigastric pain    Rx / DC Orders ED Discharge Orders         Ordered    HYDROcodone-acetaminophen (NORCO/VICODIN) 5-325 MG tablet  Every 6 hours PRN        05/02/20 4492           Merryl Hacker, MD 05/02/20 [phone]

## 2020-05-04 ENCOUNTER — Other Ambulatory Visit (HOSPITAL_COMMUNITY)
Admission: RE | Admit: 2020-05-04 | Discharge: 2020-05-04 | Disposition: A | Discharge status: Home / Self Care | Payer: BC Managed Care – PPO | Source: Ambulatory Visit | Attending: Internal Medicine | Admitting: Internal Medicine

## 2020-05-04 ENCOUNTER — Other Ambulatory Visit: Payer: Self-pay

## 2020-05-04 DIAGNOSIS — Z20822 Contact with and (suspected) exposure to covid-19: Secondary | ICD-10-CM | POA: Insufficient documentation

## 2020-05-04 DIAGNOSIS — Z01812 Encounter for preprocedural laboratory examination: Secondary | ICD-10-CM | POA: Insufficient documentation

## 2020-05-04 LAB — SARS CORONAVIRUS 2 (TAT 6-24 HRS): SARS Coronavirus 2: NEGATIVE

## 2020-05-05 ENCOUNTER — Encounter (HOSPITAL_COMMUNITY): Payer: Self-pay | Admitting: *Deleted

## 2020-05-05 ENCOUNTER — Other Ambulatory Visit: Payer: Self-pay

## 2020-05-05 ENCOUNTER — Ambulatory Visit (HOSPITAL_COMMUNITY): Payer: BC Managed Care – PPO | Admitting: Anesthesiology

## 2020-05-05 ENCOUNTER — Ambulatory Visit (HOSPITAL_COMMUNITY)
Admission: RE | Admit: 2020-05-05 | Discharge: 2020-05-05 | Disposition: A | Discharge status: Home / Self Care | Payer: BC Managed Care – PPO | Attending: Internal Medicine | Admitting: Internal Medicine

## 2020-05-05 ENCOUNTER — Encounter (HOSPITAL_COMMUNITY)
Admission: RE | Disposition: A | Discharge status: Home / Self Care | Payer: Self-pay | Source: Home / Self Care | Attending: Internal Medicine

## 2020-05-05 DIAGNOSIS — R12 Heartburn: Secondary | ICD-10-CM | POA: Diagnosis not present

## 2020-05-05 DIAGNOSIS — Z800 Family history of malignant neoplasm of digestive organs: Secondary | ICD-10-CM | POA: Insufficient documentation

## 2020-05-05 DIAGNOSIS — K219 Gastro-esophageal reflux disease without esophagitis: Secondary | ICD-10-CM | POA: Diagnosis not present

## 2020-05-05 DIAGNOSIS — F17210 Nicotine dependence, cigarettes, uncomplicated: Secondary | ICD-10-CM | POA: Insufficient documentation

## 2020-05-05 DIAGNOSIS — K2950 Unspecified chronic gastritis without bleeding: Secondary | ICD-10-CM | POA: Diagnosis not present

## 2020-05-05 DIAGNOSIS — K2970 Gastritis, unspecified, without bleeding: Secondary | ICD-10-CM

## 2020-05-05 DIAGNOSIS — R1013 Epigastric pain: Secondary | ICD-10-CM | POA: Insufficient documentation

## 2020-05-05 DIAGNOSIS — K2270 Barrett's esophagus without dysplasia: Secondary | ICD-10-CM | POA: Diagnosis not present

## 2020-05-05 HISTORY — DX: Other specified postprocedural states: Z98.890

## 2020-05-05 HISTORY — PX: ESOPHAGOGASTRODUODENOSCOPY (EGD) WITH PROPOFOL: SHX5813

## 2020-05-05 HISTORY — DX: Other specified postprocedural states: R11.2

## 2020-05-05 HISTORY — PX: BIOPSY: SHX5522

## 2020-05-05 SURGERY — ESOPHAGOGASTRODUODENOSCOPY (EGD) WITH PROPOFOL
Anesthesia: General

## 2020-05-05 MED ORDER — LIDOCAINE HCL (CARDIAC) PF 100 MG/5ML IV SOSY
PREFILLED_SYRINGE | INTRAVENOUS | Status: DC | PRN
  Administered 2020-05-05: 100 mg via INTRAVENOUS

## 2020-05-05 MED ORDER — LACTATED RINGERS IV SOLN
INTRAVENOUS | Status: DC
  Administered 2020-05-05: 1000 mL via INTRAVENOUS

## 2020-05-05 MED ORDER — PROPOFOL 10 MG/ML IV BOLUS
INTRAVENOUS | Status: DC | PRN
  Administered 2020-05-05: 140 mg via INTRAVENOUS

## 2020-05-05 MED ORDER — LIDOCAINE VISCOUS HCL 2 % MT SOLN
OROMUCOSAL | Status: AC
  Filled 2020-05-05: qty 15

## 2020-05-05 MED ORDER — STERILE WATER FOR IRRIGATION IR SOLN
Status: DC | PRN
  Administered 2020-05-05: 100 mL

## 2020-05-05 MED ORDER — LACTATED RINGERS IV SOLN: INTRAVENOUS | Status: DC | PRN

## 2020-05-05 MED ORDER — GLYCOPYRROLATE 0.2 MG/ML IJ SOLN
INTRAMUSCULAR | Status: AC
  Filled 2020-05-05: qty 1

## 2020-05-05 NOTE — Anesthesia Preprocedure Evaluation (Addendum)
Anesthesia Evaluation  Patient identified by MRN, date of birth, ID band Patient awake    Reviewed: Allergy & Precautions, H&P , NPO status , Patient's Chart, lab work & pertinent test results, reviewed documented beta blocker date and time   History of Anesthesia Complications (+) PONV and history of anesthetic complications  Airway Mallampati: II  TM Distance: >3 FB Neck ROM: full    Dental no notable dental hx. (+) Missing, Poor Dentition, Dental Advisory Given, Chipped,    Pulmonary sleep apnea , Current Smoker,    Pulmonary exam normal breath sounds clear to auscultation       Cardiovascular Exercise Tolerance: Good hypertension, negative cardio ROS   Rhythm:regular Rate:Normal     Neuro/Psych  Headaches, negative psych ROS   GI/Hepatic Neg liver ROS, GERD  Medicated,  Endo/Other  negative endocrine ROS  Renal/GU negative Renal ROS  negative genitourinary   Musculoskeletal   Abdominal   Peds  Hematology negative hematology ROS (+)   Anesthesia Other Findings   Reproductive/Obstetrics negative OB ROS                            Anesthesia Physical Anesthesia Plan  ASA: II  Anesthesia Plan: General   Post-op Pain Management:    Induction:   PONV Risk Score and Plan: Propofol infusion  Airway Management Planned:   Additional Equipment:   Intra-op Plan:   Post-operative Plan:   Informed Consent: I have reviewed the patients History and Physical, chart, labs and discussed the procedure including the risks, benefits and alternatives for the proposed anesthesia with the patient or authorized representative who has indicated his/her understanding and acceptance.     Dental Advisory Given  Plan Discussed with: CRNA  Anesthesia Plan Comments:         Anesthesia Quick Evaluation

## 2020-05-05 NOTE — Anesthesia Procedure Notes (Signed)
Date/Time: 05/05/2020 11:20 AM Performed by: Orlie Dakin, CRNA Pre-anesthesia Checklist: Patient identified, Emergency Drugs available, Suction available and Patient being monitored Patient Re-evaluated:Patient Re-evaluated prior to induction Oxygen Delivery Method: Nasal cannula Induction Type: IV induction Placement Confirmation: positive ETCO2

## 2020-05-05 NOTE — Transfer of Care (Signed)
Immediate Anesthesia Transfer of Care Note  Patient: Steven Woodard  Procedure(s) Performed: ESOPHAGOGASTRODUODENOSCOPY (EGD) WITH PROPOFOL (N/A ) BIOPSY  Patient Location: Endoscopy Unit  Anesthesia Type:General  Level of Consciousness: awake  Airway & Oxygen Therapy: Patient Spontanous Breathing  Post-op Assessment: Report given to RN and Post -op Vital signs reviewed and stable  Post vital signs: Reviewed and stable  Last Vitals:  Vitals Value Taken Time  BP 99/64 05/05/20 1124  Temp 36.5 C 05/05/20 1124  Pulse 67 05/05/20 1124  Resp 16 05/05/20 1124  SpO2 98 % 05/05/20 1124    Last Pain:  Vitals:   05/05/20 1124  TempSrc: Oral  PainSc: 0-No pain      Patients Stated Pain Goal: 7 (35/36/14 4315)  Complications: No complications documented.

## 2020-05-05 NOTE — Interval H&P Note (Signed)
History and Physical Interval Note:  05/05/2020 11:08 AM  Steven Woodard  has presented today for surgery, with the diagnosis of epigastric pain, GERD, Barrett's screening.  The various methods of treatment have been discussed with the patient and family. After consideration of risks, benefits and other options for treatment, the patient has consented to  Procedure(s) with comments: ESOPHAGOGASTRODUODENOSCOPY (EGD) WITH PROPOFOL (N/A) - 12:45pm as a surgical intervention.  The patient's history has been reviewed, patient examined, no change in status, stable for surgery.  I have reviewed the patient's chart and labs.  Questions were answered to the patient's satisfaction.     Eloise Harman

## 2020-05-05 NOTE — Op Note (Signed)
[facility] Patient Name: Steven Woodard Procedure Date: 05/05/2020 11:04 AM MRN: [id number] Date of Birth: 03/13/1976 Attending MD: Elon Alas. Abbey Chatters DO CSN: [id number] Age: 44 Admit Type: Outpatient Procedure:                Upper GI endoscopy Indications:              Epigastric abdominal pain, Heartburn Providers:                Elon Alas. Abbey Chatters, DO, Lambert Mody, Aram Candela Referring MD:              Medicines:                See the Anesthesia note for documentation of the                            administered medications Complications:            No immediate complications. Estimated Blood Loss:     Estimated blood loss was minimal. Procedure:                Pre-Anesthesia Assessment:                           - The anesthesia plan was to use monitored                            anesthesia care (MAC).                           After obtaining informed consent, the endoscope was                            passed under direct vision. Throughout the                            procedure, the patient's blood pressure, pulse, and                            oxygen saturations were monitored continuously. The                            Endoscope was introduced through the mouth, and                            advanced to the second part of duodenum. The upper                            GI endoscopy was accomplished without difficulty.                            The patient tolerated the procedure well. Scope In: 11:19:06 AM Scope Out: 11:21:35 AM Total Procedure Duration: 0 hours 2 minutes 29 seconds  Findings:      There is no endoscopic evidence of bleeding, areas of erosion,  esophagitis, hiatal hernia, inflammation, ulcerations or varices in the       entire esophagus.      Diffuse moderate inflammation characterized by erosions and erythema was       found in the entire examined stomach. Biopsies were taken with a cold        forceps for Helicobacter pylori testing.      The duodenal bulb, first portion of the duodenum and second portion of       the duodenum were normal. Biopsies for histology were taken with a cold       forceps for evaluation of celiac disease. Impression:               - Gastritis. Biopsied.                           - Normal duodenal bulb, first portion of the                            duodenum and second portion of the duodenum.                            Biopsied. Moderate Sedation:      Per Anesthesia Care Recommendation:           - Patient has a contact number available for                            emergencies. The signs and symptoms of potential                            delayed complications were discussed with the                            patient. Return to normal activities tomorrow.                            Written discharge instructions were provided to the                            patient.                           - Resume previous diet.                           - Continue present medications.                           - Await pathology results.                           - Use Protonix (pantoprazole) 40 mg PO BID for 8                            weeks, then once daily therafter.                           - Return to  GI clinic in 2 months. Consider                            scheduling colonoscopy at that time. Procedure Code(s):        --- Professional ---                           [phone], Esophagogastroduodenoscopy, flexible,                            transoral; with biopsy, single or multiple Diagnosis Code(s):        --- Professional ---                           K29.70, Gastritis, unspecified, without bleeding                           R10.13, Epigastric pain                           R12, Heartburn CPT copyright 2019 American Medical Association. All rights reserved. The codes documented in this report are preliminary and upon coder review may  be revised  to meet current compliance requirements. Elon Alas. Abbey Chatters, DO [name] Abbey Chatters, DO 05/05/2020 11:35:53 AM This report has been signed electronically. Number of Addenda: 0

## 2020-05-05 NOTE — Discharge Instructions (Addendum)
EGD Discharge instructions Please read the instructions outlined below and refer to this sheet in the next few weeks. These discharge instructions provide you with general information on caring for yourself after you leave the hospital. Your doctor may also give you specific instructions. While your treatment has been planned according to the most current medical practices available, unavoidable complications occasionally occur. If you have any problems or questions after discharge, please call your doctor. ACTIVITY  You may resume your regular activity but move at a slower pace for the next 24 hours.   Take frequent rest periods for the next 24 hours.   Walking will help expel (get rid of) the air and reduce the bloated feeling in your abdomen.   No driving for 24 hours (because of the anesthesia (medicine) used during the test).   You may shower.   Do not sign any important legal documents or operate any machinery for 24 hours (because of the anesthesia used during the test).  NUTRITION  Drink plenty of fluids.   You may resume your normal diet.   Begin with a light meal and progress to your normal diet.   Avoid alcoholic beverages for 24 hours or as instructed by your caregiver.  MEDICATIONS  You may resume your normal medications unless your caregiver tells you otherwise.  WHAT YOU CAN EXPECT TODAY  You may experience abdominal discomfort such as a feeling of fullness or "gas" pains.  FOLLOW-UP  Your doctor will discuss the results of your test with you.  SEEK IMMEDIATE MEDICAL ATTENTION IF ANY OF THE FOLLOWING OCCUR:  Excessive nausea (feeling sick to your stomach) and/or vomiting.   Severe abdominal pain and distention (swelling).   Trouble swallowing.   Temperature over 101 F (37.8 C).   Rectal bleeding or vomiting of blood.    Your EGD showed a moderate amount inflammation in your stomach.  I do not see any evidence of ulcerations.  I did take biopsies of your  stomach to rule out infection with a bacteria called H. pylori.  Also took small bowel biopsies to rule out celiac disease.  Await pathology results, my office will contact you.  Continue on Protonix 40 mg twice daily for the next 8 weeks and decrease to once daily thereafter.  Avoid NSAIDs as best you can.  Follow-up with GI in 2 months as previously scheduled  I hope you have a great rest of your week!  Elon Alas. Abbey Chatters, D.O. Gastroenterology and Hepatology [facility]

## 2020-05-05 NOTE — Anesthesia Postprocedure Evaluation (Signed)
Anesthesia Post Note  Patient: Steven Woodard  Procedure(s) Performed: ESOPHAGOGASTRODUODENOSCOPY (EGD) WITH PROPOFOL (N/A ) BIOPSY  Patient location during evaluation: Endoscopy Anesthesia Type: General Level of consciousness: awake and alert and oriented Pain management: pain level controlled Vital Signs Assessment: post-procedure vital signs reviewed and stable Respiratory status: spontaneous breathing, nonlabored ventilation and respiratory function stable Cardiovascular status: blood pressure returned to baseline and stable Postop Assessment: no apparent nausea or vomiting Anesthetic complications: no   No complications documented.   Last Vitals:  Vitals:   05/05/20 1034 05/05/20 1124  BP: 123/74 99/64  Pulse: 80 67  Resp: 18 16  Temp: 36.7 C 36.5 C  SpO2: 100% 98%    Last Pain:  Vitals:   05/05/20 1124  TempSrc: Oral  PainSc: 0-No pain                 Orlie Dakin

## 2020-05-08 ENCOUNTER — Other Ambulatory Visit: Payer: Self-pay

## 2020-05-08 LAB — SURGICAL PATHOLOGY

## 2020-05-11 ENCOUNTER — Encounter (HOSPITAL_COMMUNITY): Payer: Self-pay | Admitting: Internal Medicine

## 2020-05-31 DIAGNOSIS — Z8711 Personal history of peptic ulcer disease: Secondary | ICD-10-CM | POA: Diagnosis not present

## 2020-05-31 DIAGNOSIS — K2950 Unspecified chronic gastritis without bleeding: Secondary | ICD-10-CM | POA: Diagnosis not present

## 2020-05-31 DIAGNOSIS — E781 Pure hyperglyceridemia: Secondary | ICD-10-CM | POA: Diagnosis not present

## 2020-05-31 DIAGNOSIS — R1013 Epigastric pain: Secondary | ICD-10-CM | POA: Diagnosis not present

## 2020-06-08 DIAGNOSIS — R799 Abnormal finding of blood chemistry, unspecified: Secondary | ICD-10-CM | POA: Diagnosis not present

## 2020-06-08 DIAGNOSIS — E7800 Pure hypercholesterolemia, unspecified: Secondary | ICD-10-CM | POA: Diagnosis not present

## 2020-06-08 DIAGNOSIS — R1013 Epigastric pain: Secondary | ICD-10-CM | POA: Diagnosis not present

## 2020-07-04 ENCOUNTER — Encounter: Payer: Self-pay | Admitting: Nurse Practitioner

## 2020-07-04 ENCOUNTER — Telehealth: Payer: Self-pay | Admitting: *Deleted

## 2020-07-04 ENCOUNTER — Telehealth (INDEPENDENT_AMBULATORY_CARE_PROVIDER_SITE_OTHER): Payer: BC Managed Care – PPO | Admitting: Nurse Practitioner

## 2020-07-04 DIAGNOSIS — K219 Gastro-esophageal reflux disease without esophagitis: Secondary | ICD-10-CM

## 2020-07-04 DIAGNOSIS — R197 Diarrhea, unspecified: Secondary | ICD-10-CM

## 2020-07-04 DIAGNOSIS — Z1159 Encounter for screening for other viral diseases: Secondary | ICD-10-CM

## 2020-07-04 DIAGNOSIS — R1013 Epigastric pain: Secondary | ICD-10-CM

## 2020-07-04 DIAGNOSIS — E782 Mixed hyperlipidemia: Secondary | ICD-10-CM | POA: Insufficient documentation

## 2020-07-04 NOTE — Telephone Encounter (Signed)
Pt consented to a virtual visit. 

## 2020-07-04 NOTE — Telephone Encounter (Signed)
Steven Woodard, you are scheduled for a virtual visit with your provider today.  Just as we do with appointments in the office, we must obtain your consent to participate.  Your consent will be active for this visit and any virtual visit you may have with one of our providers in the next 365 days.  If you have a MyChart account, I can also send a copy of this consent to you electronically.  All virtual visits are billed to your insurance company just like a traditional visit in the office.  As this is a virtual visit, video technology does not allow for your provider to perform a traditional examination.  This may limit your provider's ability to fully assess your condition.  If your provider identifies any concerns that need to be evaluated in person or the need to arrange testing such as labs, EKG, etc, we will make arrangements to do so.  Although advances in technology are sophisticated, we cannot ensure that it will always work on either your end or our end.  If the connection with a video visit is poor, we may have to switch to a telephone visit.  With either a video or telephone visit, we are not always able to ensure that we have a secure connection.   I need to obtain your verbal consent now.   Are you willing to proceed with your visit today?

## 2020-07-04 NOTE — Progress Notes (Addendum)
Referring Provider: Wynona Dove, PA* Primary Care Physician:  Wynona Dove, PA-C Primary GI:  Dr. Abbey Chatters  NOTE: Service was provided via telemedicine and was requested by the patient due to COVID-19 pandemic.  Patient Location: Home  Provider Location: [place] office  Reason for Phone Visit: Follow-up  Persons present on the phone encounter, with roles: Patient, myself (provider), Zara Council, LPN (updated meds and allergies)  Total time (minutes) spent on medical discussion: 23 minutes  Due to COVID-19, visit was conducted using the virtual method noted. Visit was requested by patient.  I connected with Susa Day on 07/07/20 at  9:00 AM EST by Video Call and verified that I am speaking with the correct person using two identifiers.   I discussed the limitations, risks, security and privacy concerns of performing an evaluation and management service by telephone and the availability of in person appointments. I also discussed with the patient that there may be a patient responsible charge related to this service. The patient expressed understanding and agreed to proceed.  Chief Complaint  Patient presents with   Gastroesophageal Reflux    Has abdominal pain approx 15 minutes after taking Protonix. Will last approx 2 hours and then eases up.   Diarrhea    Everyday for past week    HPI:   Steven Woodard is a 45 y.o. male who presents for virtual visit regarding: follow-up on GERD.  The patient was last seen in our office 04/27/2020 for the same as well as epigastric pain and family history of colon cancer in his father.  His last visit was a referral from the emergency department.  CT of the abdomen and pelvis at that time without acute pathology.  At his last visit noted persistent epigastric abdominal pain with associated nausea, no previous EGD for Barrett's screening.  Pain multiple times a day on Protonix and famotidine.  Worsening symptoms with any intake  including water.  Currently on meloxicam daily, ibuprofen as needed though has not had these in the previous 2 to 3 months.  Recommended increase Protonix to 40 mg twice daily, Pepcid in the evenings as needed, EGD for evaluation, follow-up in 2 months to schedule colonoscopy.  EGD completed 05/05/2020 which found normal esophagus, diffuse moderate gastritis status post biopsy, normal duodenum status post biopsy.  Surgical pathology found the biopsies to be slight chronic inflammation negative for H. pylori.  Duodenal biopsies unremarkable.  Recommended Protonix twice a day for 8 weeks then decrease to once a day after that.  Today states he is doing okay overall. He is on once a day Protonix. He began having diarrhea and abdominal cramping a couple weeks ago and feels this occurs after he takes Protonix and typically resolves on it's own after a couple hours. This has been ongoing since he started Protonix. Diarrhea started about 1.5 weeks ago after he eats. No known changes.  He has about 2 stools a day, watery/soft. He denies abdominal cramping or pain associated with this. Denies nausea and vomiting, hematochezia, melena, fever, chills, unintentional weight loss. Denies URI or flu-like symptoms. Denies loss of sense of taste or smell. The patient has received COVID-19 vaccination(s). Denies chest pain, dyspnea, dizziness, lightheadedness, syncope, near syncope. Denies any other upper or lower GI symptoms. We discussed CDC recommended HCV screening and he is agreeable.   He starts his PCP checked his triglycerides and they were just over 1,000. His lipase was 144 at that time. Recheck about 2 months  ago had normal lipase 44. He was started on fenofibrate for his triglycerides and will see PCP in a week for repeat labs results.  He is due for CRC screening due to primary family history (father) approx age 27.  Past Medical History:  Diagnosis Date   Arthritis    GERD (gastroesophageal reflux disease)     Hypertension    PONV (postoperative nausea and vomiting)     Past Surgical History:  Procedure Laterality Date   BIOPSY  05/05/2020   Procedure: BIOPSY;  Surgeon: Eloise Harman, DO;  Location: [facility];  Service: Endoscopy;;   ESOPHAGOGASTRODUODENOSCOPY (EGD) WITH PROPOFOL N/A 05/05/2020   Procedure: ESOPHAGOGASTRODUODENOSCOPY (EGD) WITH PROPOFOL;  Surgeon: Eloise Harman, DO;  Location: [facility];  Service: Endoscopy;  Laterality: N/A;  12:45pm   KNEE ARTHROSCOPY Left    neck fusion      Current Outpatient Medications  Medication Sig Dispense Refill   acetaminophen (TYLENOL) 500 MG tablet Take 1,000 mg by mouth every 6 (six) hours as needed (pain.).     amLODipine (NORVASC) 10 MG tablet Take 10 mg by mouth daily.     cholecalciferol (VITAMIN D3) 25 MCG (1000 UNIT) tablet Take 1,000 Units by mouth daily.     famotidine (PEPCID) 20 MG tablet Take 20 mg by mouth daily.     fenofibrate 160 MG tablet Take 160 mg by mouth daily.     gabapentin (NEURONTIN) 300 MG capsule Take 300 mg by mouth 3 (three) times daily as needed (nerve pain).      HYDROcodone-acetaminophen (NORCO/VICODIN) 5-325 MG tablet Take 1 tablet by mouth every 6 (six) hours as needed. (Patient taking differently: Take 1 tablet by mouth every 6 (six) hours as needed (pain.).) 10 tablet 0   Omega-3 Fatty Acids (FISH OIL) 1000 MG CAPS Take 1,000 mg by mouth every evening.      pantoprazole (PROTONIX) 40 MG tablet Take 1 tablet (40 mg total) by mouth 2 (two) times daily before a meal. (Patient taking differently: Take 40 mg by mouth daily.) 120 tablet 3   No current facility-administered medications for this visit.    Allergies as of 07/04/2020 - Review Complete 07/04/2020  Allergen Reaction Noted   Aspirin  02/21/2015    Family History  Problem Relation Age of Onset   Stroke Father    Heart disease Father    Colon cancer Father 73       From what he remembers, was a young child    Gastric cancer Neg Hx    Esophageal cancer Neg Hx     Social History   Socioeconomic History   Marital status: Married    Spouse name: Not on file   Number of children: Not on file   Years of education: Not on file   Highest education level: Not on file  Occupational History   Not on file  Tobacco Use   Smoking status: Current Every Day Smoker    Packs/day: 0.50    Years: 28.00    Pack years: 14.00    Types: Cigarettes   Smokeless tobacco: Never Used   Tobacco comment: vapes also  Vaping Use   Vaping Use: Every day  Substance and Sexual Activity   Alcohol use: No    Alcohol/week: 0.0 standard drinks   Drug use: No   Sexual activity: Yes    Partners: Female  Other Topics Concern   Not on file  Social History Narrative   1 cup of  coffee, 1 soda, 1 tea at night    Married   GED highest education   Employed as a Personal assistant Strain: Not on file  Food Insecurity: Not on file  Transportation Needs: Not on file  Physical Activity: Not on file  Stress: Not on file  Social Connections: Not on file    Review of Systems: Review of Systems  Constitutional: Negative for chills, fever, malaise/fatigue and weight loss.  HENT: Negative for congestion and sore throat.   Respiratory: Negative for cough and shortness of breath.   Cardiovascular: Negative for chest pain and palpitations.  Gastrointestinal: Positive for abdominal pain and diarrhea. Negative for blood in stool, constipation, heartburn, melena, nausea and vomiting.  Musculoskeletal: Negative for joint pain and myalgias.  Skin: Negative for rash.  Neurological: Negative for dizziness and weakness.  Endo/Heme/Allergies: Does not bruise/bleed easily.  Psychiatric/Behavioral: Negative for depression. The patient is not nervous/anxious.   All other systems reviewed and are negative.   Physical Exam: Note: limited exam due to virtual visit There were  no vitals taken for this visit. Physical Exam Nursing note reviewed.  Constitutional:      General: He is not in acute distress.    Appearance: Normal appearance. He is not ill-appearing, toxic-appearing or diaphoretic.  HENT:     Head: Normocephalic and atraumatic.     Nose: No congestion or rhinorrhea.  Eyes:     General: No scleral icterus. Pulmonary:     Effort: Pulmonary effort is normal.  Abdominal:     General: There is no distension.     Palpations: There is no hepatomegaly or splenomegaly.  Skin:    Coloration: Skin is not jaundiced.     Findings: No bruising.  Neurological:     General: No focal deficit present.     Mental Status: He is alert and oriented to person, place, and time. Mental status is at baseline.  Psychiatric:        Mood and Affect: Mood normal.        Behavior: Behavior normal.        Thought Content: Thought content normal.       Assessment: Very pleasant 45 year old male presents for follow-up on GERD and to schedule colonoscopy.  Known history of gastritis on previous EGD in November 2021.  No red flag/warning signs or symptoms.  GERD/gastritis: Currently on Protonix.  Notes after he takes medication he has abdominal cramping and diarrhea.  His diarrhea started about 1/2 weeks ago and is typically after he eats.  Unsure exactly if it is postmedication or postprandial.  Given that she started 1 and half weeks ago this seems less likely related to his PPI.  However, he is requesting a change.  No associated nausea and vomiting, no melena.  At this point I will change his Protonix to omeprazole to see if we can give him GERD/gastritis control and a change in stools.  Further work-up for loose stools below.  Need for colonoscopy: Due for colorectal cancer screening due to a primary family history in his father at approximate age 62.  He is currently 45 years old.  Denies hematochezia, unintentional weight loss, other red flag/warning signs or symptoms.   At this point we will plan for a colonoscopy and is high risk patient.  I explained that he will likely never go longer than 5 years in between colonoscopies given his high risk status.  Need for hepatitis C  screening: He states he has never been screened for hepatitis C before.  He is due for one-time screening as recommended by the CDC for all adults.  He is amendable to testing and I will put in the order today.  Abdominal pain and diarrhea: Given his diarrhea, which could be an adverse effect was Protonix although I doubt it because he is just started having diarrhea 1 weeks ago.  However, we are changing his PPI as per above.  I will also check stool studies for occult infection given his recent healthcare exposure when he had an EGD.  Further recommendations will follow.  I will also check a CT scan to further evaluate his abdominal pain and recent triglyceride level over 1000 to rule out acute pancreatitis or diverticulitis.  Further recommendations will follow.   Proceed with TCS on propofol/MAC with Dr. Abbey Chatters on propofol/MAC in near future: the risks, benefits, and alternatives have been discussed with the patient in detail. The patient states understanding and desires to proceed.  ASA II   Plan:  1. Colonoscopy as noted above 2. Hepatitis C antibody 3. Stool studies including C. difficile and GI pathogen panel 4. CT of the abdomen and pelvis with contrast 5. Change Protonix to omeprazole 40 mg once daily 6. Follow-up in 2 months 7. Call for worsening symptoms.     Thank you for allowing [place] to participate in the care of [employer].  Walden Field, DNP, AGNP-C Adult & Gerontological Nurse Practitioner [facility]

## 2020-07-07 ENCOUNTER — Encounter: Payer: Self-pay | Admitting: Nurse Practitioner

## 2020-07-10 ENCOUNTER — Telehealth: Payer: Self-pay

## 2020-07-10 DIAGNOSIS — E782 Mixed hyperlipidemia: Secondary | ICD-10-CM | POA: Diagnosis not present

## 2020-07-10 DIAGNOSIS — Z87891 Personal history of nicotine dependence: Secondary | ICD-10-CM | POA: Diagnosis not present

## 2020-07-10 DIAGNOSIS — Z0001 Encounter for general adult medical examination with abnormal findings: Secondary | ICD-10-CM | POA: Diagnosis not present

## 2020-07-10 DIAGNOSIS — I10 Essential (primary) hypertension: Secondary | ICD-10-CM | POA: Diagnosis not present

## 2020-07-10 DIAGNOSIS — Z23 Encounter for immunization: Secondary | ICD-10-CM | POA: Diagnosis not present

## 2020-07-10 NOTE — Telephone Encounter (Signed)
Steven Woodard  The lab called and stated that this pt's insurance ([employer]) does not cover for a patient to have HEP.C with the DX's codes that was provided. Especially the one in the lifetime draw. She let the pt leave today and he took the stool container with him. Once he bring it back she will draw it then if we have a code if not the patient will have to sign a waiver to decline. Test itself is $300t. Please advise.

## 2020-07-10 NOTE — Telephone Encounter (Signed)
Seen CT order in workqueue and pt needs TCS per OV note.  We haven't received instructions for imaging or procedure. Please advise.

## 2020-07-12 DIAGNOSIS — Z1159 Encounter for screening for other viral diseases: Secondary | ICD-10-CM | POA: Insufficient documentation

## 2020-07-12 NOTE — Telephone Encounter (Signed)
Instructions provided

## 2020-07-12 NOTE — Telephone Encounter (Signed)
CT abd/pelvis w/contrast scheduled for 08/03/20 at 5:00pm, arrive at 4:30pm. Pick up contrast prior to test. NPO 4 hours prior to test.  Called pt, informed him of CT appt. Letter mailed. TCS w/Propofol w/Dr. Abbey Chatters scheduled for 08/14/20 at [facility] test 08/11/20. Orders entered. Appt letter mailed with procedure instructions.

## 2020-07-12 NOTE — Telephone Encounter (Signed)
I updated his visit. Have them try Z11.59.  Thanks

## 2020-07-12 NOTE — Patient Instructions (Addendum)
Your health issues we discussed today were:   GERD: 1. As requested we will change her acid blocker. 2. Stop taking Protonix 3. Start taking omeprazole 40 mg once a day.  Take this pursing the morning and stomach 4. Let us know if you have any worsening or severe symptoms  Abdominal pain with diarrhea: 1. As we discussed, have your stool test completed when you are able to 2. We will also check a CT scan of your abdomen to evaluate for any issues with your pancreas or other concerning findings 3. Further recommendations will follow your CT and lab results 4. Because we have any worsening or severe symptoms  Need for colonoscopy: 1. We will schedule colonoscopy for you 2. Further recommendations will follow your colonoscopy  Overall I recommend:  1. Continue other current medications 2. Return for follow-up in 2 months 3. Call for any questions or concerns   ---------------------------------------------------------------  At [facility] we value your feedback. You may receive a survey about your visit today. Please share your experience as we strive to create trusting relationships with our patients to provide genuine, compassionate, quality care.  We appreciate your understanding and patience as we review any laboratory studies, imaging, and other diagnostic tests that are ordered as we care for you. Our office policy is 5 business days for review of these results, and any emergent or urgent results are addressed in a timely manner for your best interest. If you do not hear from our office in 1 week, please contact us.   We also encourage the use of MyChart, which contains your medical information for your review as well. If you are not enrolled in this feature, an access code is on this after visit summary for your convenience. Thank you for allowing [place] to be involved in your care.  It was great to see you today!  I hope you have a Happy New  Year!!    ---------------------------------------------------------------

## 2020-07-13 ENCOUNTER — Other Ambulatory Visit: Payer: Self-pay

## 2020-07-13 NOTE — Telephone Encounter (Signed)
PA for CT abd/pelvis w/contrast submitted via [facility]. Case went to clinical review. Clinical notes uploaded. Case# [id number].

## 2020-07-14 NOTE — Telephone Encounter (Signed)
Noted. Waiting on the pt to return stool container

## 2020-07-19 NOTE — Telephone Encounter (Signed)
Received fax from [facility]. CT was determined not medically necessary. An Probation officer is available to speak with you about this determination. You can reach a peer reviewer or obtain a copy of the criteria used for this determination by calling [phone].  The decision was based on tBased on [facility] Abdomen [facility] Guidelines Section(s): AB 2.5 Epigastric Pain and  Dyspepsia and 1.0 General Guidelines and Preface to the Imaging Guidelines, sec tion Preface-3  Clinical Information, we cannot approve this request. Your records show that you have pain in  the upper part of your belly (abdomen). The request cannot be approved because: Your records did not include a detailed history that shows this study is needed. A study similar to the one requested has recently been performed. The results of that study  showed your doctor what they needed to see in order to treat your condition. No additional  imaging is necessary at this time. Medical policies and criteria related to this decision are available for your review by calling  [facility] at [phone]. Your provider has the right to speak with one of the [facility] Medical Directors if he or she has questions regarding this decision, by calling [employer] at [phone]. You have the right to appeal this decision. To initiate an appeal, or to ask questions or  request further detail on the reasons why these services were denied, please call Customer Care at the phone number listed on your identification card or [phone].  Routing to Walden Field NP.

## 2020-07-31 ENCOUNTER — Telehealth: Payer: Self-pay | Admitting: Internal Medicine

## 2020-07-31 NOTE — Telephone Encounter (Signed)
[phone] PLEASE CALL PATIENT, GOT A LETTER FROM HIS INSURANCE THAT THEY WILL NOT PAY FOR HIS UPCOMING CT SCAN

## 2020-08-01 NOTE — Telephone Encounter (Signed)
FYI to Walden Field, NP. Telephone note has already been sent explaining insurance decision.

## 2020-08-02 NOTE — Telephone Encounter (Signed)
MyChart message was sent back to the patient.

## 2020-08-02 NOTE — Telephone Encounter (Signed)
Noted  

## 2020-08-03 ENCOUNTER — Ambulatory Visit (HOSPITAL_COMMUNITY): Payer: BC Managed Care – PPO

## 2020-08-07 NOTE — Telephone Encounter (Signed)
Per appt desk, CT appt was cancelled.

## 2020-08-11 ENCOUNTER — Telehealth: Payer: Self-pay

## 2020-08-11 ENCOUNTER — Other Ambulatory Visit: Payer: Self-pay

## 2020-08-11 ENCOUNTER — Other Ambulatory Visit (HOSPITAL_COMMUNITY)
Admission: RE | Admit: 2020-08-11 | Discharge: 2020-08-11 | Disposition: A | Discharge status: Home / Self Care | Payer: BC Managed Care – PPO | Source: Ambulatory Visit | Attending: Internal Medicine | Admitting: Internal Medicine

## 2020-08-11 DIAGNOSIS — F17210 Nicotine dependence, cigarettes, uncomplicated: Secondary | ICD-10-CM | POA: Diagnosis not present

## 2020-08-11 DIAGNOSIS — Z01812 Encounter for preprocedural laboratory examination: Secondary | ICD-10-CM | POA: Insufficient documentation

## 2020-08-11 DIAGNOSIS — Z20822 Contact with and (suspected) exposure to covid-19: Secondary | ICD-10-CM | POA: Insufficient documentation

## 2020-08-11 DIAGNOSIS — Z79899 Other long term (current) drug therapy: Secondary | ICD-10-CM | POA: Diagnosis not present

## 2020-08-11 DIAGNOSIS — K219 Gastro-esophageal reflux disease without esophagitis: Secondary | ICD-10-CM | POA: Diagnosis not present

## 2020-08-11 DIAGNOSIS — K648 Other hemorrhoids: Secondary | ICD-10-CM | POA: Diagnosis not present

## 2020-08-11 DIAGNOSIS — R197 Diarrhea, unspecified: Secondary | ICD-10-CM | POA: Diagnosis not present

## 2020-08-11 DIAGNOSIS — Z886 Allergy status to analgesic agent status: Secondary | ICD-10-CM | POA: Diagnosis not present

## 2020-08-11 DIAGNOSIS — D123 Benign neoplasm of transverse colon: Secondary | ICD-10-CM | POA: Diagnosis not present

## 2020-08-11 DIAGNOSIS — Z1211 Encounter for screening for malignant neoplasm of colon: Secondary | ICD-10-CM | POA: Diagnosis not present

## 2020-08-11 DIAGNOSIS — Z800 Family history of malignant neoplasm of digestive organs: Secondary | ICD-10-CM | POA: Diagnosis not present

## 2020-08-11 DIAGNOSIS — R1013 Epigastric pain: Secondary | ICD-10-CM | POA: Diagnosis not present

## 2020-08-11 DIAGNOSIS — Z1159 Encounter for screening for other viral diseases: Secondary | ICD-10-CM | POA: Diagnosis not present

## 2020-08-11 LAB — SARS CORONAVIRUS 2 (TAT 6-24 HRS): SARS Coronavirus 2: NEGATIVE

## 2020-08-11 NOTE — Telephone Encounter (Signed)
Call from the [facility] regarding pt not having diarrhea @this  time to do stool samples and a code was needed for Hepatitis C Screening (Z11.59) that was given to the tech so this can be covered by the pt's insurance. "he was advised by the lab to keep the container until he does have diarrhea", to which he agreed.

## 2020-08-14 ENCOUNTER — Other Ambulatory Visit: Payer: Self-pay

## 2020-08-14 ENCOUNTER — Encounter (HOSPITAL_COMMUNITY): Payer: Self-pay

## 2020-08-14 ENCOUNTER — Ambulatory Visit (HOSPITAL_COMMUNITY): Payer: BC Managed Care – PPO | Admitting: Anesthesiology

## 2020-08-14 ENCOUNTER — Encounter (HOSPITAL_COMMUNITY)
Admission: RE | Disposition: A | Discharge status: Home / Self Care | Payer: Self-pay | Source: Home / Self Care | Attending: Internal Medicine

## 2020-08-14 ENCOUNTER — Ambulatory Visit (HOSPITAL_COMMUNITY)
Admission: RE | Admit: 2020-08-14 | Discharge: 2020-08-14 | Disposition: A | Discharge status: Home / Self Care | Payer: BC Managed Care – PPO | Attending: Internal Medicine | Admitting: Internal Medicine

## 2020-08-14 DIAGNOSIS — Z800 Family history of malignant neoplasm of digestive organs: Secondary | ICD-10-CM | POA: Diagnosis not present

## 2020-08-14 DIAGNOSIS — K648 Other hemorrhoids: Secondary | ICD-10-CM | POA: Insufficient documentation

## 2020-08-14 DIAGNOSIS — Z79899 Other long term (current) drug therapy: Secondary | ICD-10-CM | POA: Insufficient documentation

## 2020-08-14 DIAGNOSIS — G4730 Sleep apnea, unspecified: Secondary | ICD-10-CM | POA: Diagnosis not present

## 2020-08-14 DIAGNOSIS — Z20822 Contact with and (suspected) exposure to covid-19: Secondary | ICD-10-CM | POA: Insufficient documentation

## 2020-08-14 DIAGNOSIS — Z1211 Encounter for screening for malignant neoplasm of colon: Secondary | ICD-10-CM | POA: Diagnosis not present

## 2020-08-14 DIAGNOSIS — F17210 Nicotine dependence, cigarettes, uncomplicated: Secondary | ICD-10-CM | POA: Insufficient documentation

## 2020-08-14 DIAGNOSIS — D123 Benign neoplasm of transverse colon: Secondary | ICD-10-CM | POA: Diagnosis not present

## 2020-08-14 DIAGNOSIS — K635 Polyp of colon: Secondary | ICD-10-CM

## 2020-08-14 DIAGNOSIS — Z886 Allergy status to analgesic agent status: Secondary | ICD-10-CM | POA: Diagnosis not present

## 2020-08-14 HISTORY — PX: POLYPECTOMY: SHX5525

## 2020-08-14 HISTORY — PX: COLONOSCOPY WITH PROPOFOL: SHX5780

## 2020-08-14 LAB — HEPATITIS C ANTIBODY
Hepatitis C Ab: NONREACTIVE
SIGNAL TO CUT-OFF: 0.01 (ref <1.00)

## 2020-08-14 SURGERY — COLONOSCOPY WITH PROPOFOL
Anesthesia: General

## 2020-08-14 MED ORDER — LACTATED RINGERS IV SOLN: INTRAVENOUS | Status: DC

## 2020-08-14 MED ORDER — OMEPRAZOLE 20 MG PO CPDR: 20.0000 mg | DELAYED_RELEASE_CAPSULE | Freq: Two times a day (BID) | ORAL | 11 refills | Status: AC

## 2020-08-14 MED ORDER — PROPOFOL 10 MG/ML IV BOLUS
INTRAVENOUS | Status: DC | PRN
  Administered 2020-08-14: 100 mg via INTRAVENOUS
  Administered 2020-08-14: 50 mg via INTRAVENOUS
  Administered 2020-08-14: 40 mg via INTRAVENOUS

## 2020-08-14 MED ORDER — PROPOFOL 500 MG/50ML IV EMUL
INTRAVENOUS | Status: DC | PRN
  Administered 2020-08-14: 150 ug/kg/min via INTRAVENOUS

## 2020-08-14 NOTE — Anesthesia Preprocedure Evaluation (Addendum)
Anesthesia Evaluation  Patient identified by MRN, date of birth, ID band Patient awake    Reviewed: Allergy & Precautions, NPO status , Patient's Chart, lab work & pertinent test results  History of Anesthesia Complications (+) PONV and history of anesthetic complications  Airway Mallampati: II  TM Distance: >3 FB Neck ROM: Full   Comment: Neck sx Dental  (+) Poor Dentition, Dental Advisory Given, Chipped   Pulmonary sleep apnea , Current Smoker,    Pulmonary exam normal breath sounds clear to auscultation       Cardiovascular Exercise Tolerance: Good hypertension, Pt. on medications Normal cardiovascular exam Rhythm:Regular Rate:Normal     Neuro/Psych  Headaches, negative psych ROS   GI/Hepatic Neg liver ROS, GERD  Medicated,  Endo/Other  negative endocrine ROS  Renal/GU negative Renal ROS     Musculoskeletal  (+) Arthritis  (neck sx),   Abdominal   Peds  Hematology negative hematology ROS (+)   Anesthesia Other Findings   Reproductive/Obstetrics                            Anesthesia Physical Anesthesia Plan  ASA: II  Anesthesia Plan: General   Post-op Pain Management:    Induction: Intravenous  PONV Risk Score and Plan:   Airway Management Planned: Nasal Cannula and Natural Airway  Additional Equipment:   Intra-op Plan:   Post-operative Plan:   Informed Consent: I have reviewed the patients History and Physical, chart, labs and discussed the procedure including the risks, benefits and alternatives for the proposed anesthesia with the patient or authorized representative who has indicated his/her understanding and acceptance.     Dental advisory given  Plan Discussed with: CRNA and Surgeon  Anesthesia Plan Comments:         Anesthesia Quick Evaluation

## 2020-08-14 NOTE — Transfer of Care (Signed)
Immediate Anesthesia Transfer of Care Note  Patient: Steven Woodard  Procedure(s) Performed: COLONOSCOPY WITH PROPOFOL (N/A ) POLYPECTOMY  Patient Location: PACU  Anesthesia Type:General  Level of Consciousness: awake and alert   Airway & Oxygen Therapy: Patient Spontanous Breathing  Post-op Assessment: Report given to RN and Post -op Vital signs reviewed and stable  Post vital signs: Reviewed and stable  Last Vitals:  Vitals Value Taken Time  BP    Temp 36.8 C 08/14/20 0836  Pulse 62   Resp 16 08/14/20 0836  SpO2 97     Last Pain:  Vitals:   08/14/20 0836  TempSrc: Oral  PainSc: 0-No pain      Patients Stated Pain Goal: 7 (19/41/74 0814)  Complications: No complications documented.

## 2020-08-14 NOTE — Anesthesia Postprocedure Evaluation (Signed)
Anesthesia Post Note  Patient: Steven Woodard  Procedure(s) Performed: COLONOSCOPY WITH PROPOFOL (N/A ) POLYPECTOMY  Patient location during evaluation: Phase II Anesthesia Type: General Level of consciousness: awake and oriented Pain management: satisfactory to patient Vital Signs Assessment: post-procedure vital signs reviewed and stable Respiratory status: spontaneous breathing and respiratory function stable Cardiovascular status: blood pressure returned to baseline and stable Postop Assessment: no apparent nausea or vomiting Anesthetic complications: no   No complications documented.   Last Vitals:  Vitals:   08/14/20 0709 08/14/20 0836  BP: 131/78   Pulse: 84   Resp: 19 16  Temp: 36.8 C 36.8 C  SpO2: 100%     Last Pain:  Vitals:   08/14/20 0836  TempSrc: Oral  PainSc: 0-No pain                 Karna Dupes

## 2020-08-14 NOTE — Op Note (Signed)
[facility] Patient Name: Steven Woodard Procedure Date: 08/14/2020 8:02 AM MRN: [id number] Date of Birth: 04/04/1976 Attending MD: Elon Alas. Abbey Chatters DO CSN: [id number] Age: 45 Admit Type: Outpatient Procedure:                Colonoscopy Indications:              Screening in patient at increased risk: Family                            history of 1st-degree relative with colorectal                            cancer before age 45 years Providers:                Elon Alas. Abbey Chatters, DO, Gwenlyn Fudge, RN, Aram Candela Referring MD:              Medicines:                See the Anesthesia note for documentation of the                            administered medications Complications:            No immediate complications. Estimated Blood Loss:     Estimated blood loss was minimal. Procedure:                Pre-Anesthesia Assessment:                           - The anesthesia plan was to use monitored                            anesthesia care (MAC).                           After obtaining informed consent, the colonoscope                            was passed under direct vision. Throughout the                            procedure, the patient's blood pressure, pulse, and                            oxygen saturations were monitored continuously. The                            [id number]-HQ190L ([id number]) scope was introduced through                            the anus and advanced to the the cecum, identified                            by appendiceal orifice and ileocecal valve. The  colonoscopy was performed without difficulty. The                            patient tolerated the procedure well. The quality                            of the bowel preparation was evaluated using the                            BBPS [facility] Bowel Preparation Scale) with scores                            of: Right Colon = 3, Transverse Colon = 3 and Left                             Colon = 3 (entire mucosa seen well with no residual                            staining, small fragments of stool or opaque                            liquid). The total BBPS score equals 9. Scope In: 8:15:48 AM Scope Out: 8:33:01 AM Scope Withdrawal Time: 0 hours 14 minutes 28 seconds  Total Procedure Duration: 0 hours 17 minutes 13 seconds  Findings:      The perianal and digital rectal examinations were normal.      Non-bleeding internal hemorrhoids were found during endoscopy.      A 5 mm polyp was found in the transverse colon. The polyp was sessile.       The polyp was removed with a cold snare. Resection and retrieval were       complete.      The exam was otherwise without abnormality. Impression:               - Non-bleeding internal hemorrhoids.                           - One 5 mm polyp in the transverse colon, removed                            with a cold snare. Resected and retrieved.                           - The examination was otherwise normal. Moderate Sedation:      Per Anesthesia Care Recommendation:           - Patient has a contact number available for                            emergencies. The signs and symptoms of potential                            delayed complications were discussed with the  patient. Return to normal activities tomorrow.                            Written discharge instructions were provided to the                            patient.                           - Resume previous diet.                           - Continue present medications.                           - Await pathology results.                           - Repeat colonoscopy in 5 years for surveillance.                           - Return to GI clinic PRN. Procedure Code(s):        --- Professional ---                           [phone], Colonoscopy, flexible; with removal of                            tumor(s), polyp(s), or  other lesion(s) by snare                            technique Diagnosis Code(s):        --- Professional ---                           Z80.0, Family history of malignant neoplasm of                            digestive organs                           K63.5, Polyp of colon                           K64.8, Other hemorrhoids CPT copyright 2019 American Medical Association. All rights reserved. The codes documented in this report are preliminary and upon coder review may  be revised to meet current compliance requirements. Elon Alas. Abbey Chatters, DO [place] Abbey Chatters, DO 08/14/2020 8:37:09 AM This report has been signed electronically. Number of Addenda: 0

## 2020-08-14 NOTE — Discharge Instructions (Addendum)
Colonoscopy Discharge Instructions  Read the instructions outlined below and refer to this sheet in the next few weeks. These discharge instructions provide you with general information on caring for yourself after you leave the hospital. Your doctor may also give you specific instructions. While your treatment has been planned according to the most current medical practices available, unavoidable complications occasionally occur.   ACTIVITY  You may resume your regular activity, but move at a slower pace for the next 24 hours.   Take frequent rest periods for the next 24 hours.   Walking will help get rid of the air and reduce the bloated feeling in your belly (abdomen).   No driving for 24 hours (because of the medicine (anesthesia) used during the test).    Do not sign any important legal documents or operate any machinery for 24 hours (because of the anesthesia used during the test).  NUTRITION  Drink plenty of fluids.   You may resume your normal diet as instructed by your doctor.   Begin with a light meal and progress to your normal diet. Heavy or fried foods are harder to digest and may make you feel sick to your stomach (nauseated).   Avoid alcoholic beverages for 24 hours or as instructed.  MEDICATIONS  You may resume your normal medications unless your doctor tells you otherwise.  WHAT YOU CAN EXPECT TODAY  Some feelings of bloating in the abdomen.   Passage of more gas than usual.   Spotting of blood in your stool or on the toilet paper.  IF YOU HAD POLYPS REMOVED DURING THE COLONOSCOPY:  No aspirin products for 7 days or as instructed.   No alcohol for 7 days or as instructed.   Eat a soft diet for the next 24 hours.  FINDING OUT THE RESULTS OF YOUR TEST Not all test results are available during your visit. If your test results are not back during the visit, make an appointment with your caregiver to find out the results. Do not assume everything is normal if  you have not heard from your caregiver or the medical facility. It is important for you to follow up on all of your test results.  SEEK IMMEDIATE MEDICAL ATTENTION IF:  You have more than a spotting of blood in your stool.   Your belly is swollen (abdominal distention).   You are nauseated or vomiting.   You have a temperature over 101.   You have abdominal pain or discomfort that is severe or gets worse throughout the day.   Your colonoscopy revealed 1 polyp(s) which I removed successfully. Await pathology results, my office will contact you. I recommend repeating colonoscopy in 5 years for surveillance purposes. Follow up with GI as needed.   I hope you have a great rest of your week!  Elon Alas. Abbey Chatters, D.O. Gastroenterology and Hepatology [facility]   Colon Polyps  Colon polyps are tissue growths inside the colon, which is part of the large intestine. They are one of the types of polyps that can grow in the body. A polyp may be a round bump or a mushroom-shaped growth. You could have one polyp or more than one. Most colon polyps are noncancerous (benign). However, some colon polyps can become cancerous over time. Finding and removing the polyps early can help prevent this. What are the causes? The exact cause of colon polyps is not known. What increases the risk? The following factors may make you more likely to develop this  condition:  Having a family history of colorectal cancer or colon polyps.  Being older than 43 years of age.  Being younger than 43 years of age and having a significant family history of colorectal cancer or colon polyps or a genetic condition that puts you at higher risk of getting colon polyps.  Having inflammatory bowel disease, such as ulcerative colitis or Crohn's disease.  Having certain conditions passed from parent to child (hereditary conditions), such as: ? Familial adenomatous polyposis (FAP). ? Lynch  syndrome. ? Turcot syndrome. ? Peutz-Jeghers syndrome. ? MUTYH-associated polyposis (MAP).  Being overweight.  Certain lifestyle factors. These include smoking cigarettes, drinking too much alcohol, not getting enough exercise, and eating a diet that is high in fat and red meat and low in fiber.  Having had childhood cancer that was treated with radiation of the abdomen. What are the signs or symptoms? Many times, there are no symptoms. If you have symptoms, they may include:  Blood coming from the rectum during a bowel movement.  Blood in the stool (feces). The blood may be bright red or very dark in color.  Pain in the abdomen.  A change in bowel habits, such as constipation or diarrhea. How is this diagnosed? This condition is diagnosed with a colonoscopy. This is a procedure in which a lighted, flexible scope is inserted into the opening between the buttocks (anus) and then passed into the colon to examine the area. Polyps are sometimes found when a colonoscopy is done as part of routine cancer screening tests. How is this treated? This condition is treated by removing any polyps that are found. Most polyps can be removed during a colonoscopy. Those polyps will then be tested for cancer. Additional treatment may be needed depending on the results of testing. Follow these instructions at home: Eating and drinking  Eat foods that are high in fiber, such as fruits, vegetables, and whole grains.  Eat foods that are high in calcium and vitamin D, such as milk, cheese, yogurt, eggs, liver, fish, and broccoli.  Limit foods that are high in fat, such as fried foods and desserts.  Limit the amount of red meat, precooked or cured meat, or other processed meat that you eat, such as hot dogs, sausages, bacon, or meat loaves.  Limit sugary drinks.   Lifestyle  Maintain a healthy weight, or lose weight if recommended by your health care provider.  Exercise every day or as told by your  health care provider.  Do not use any products that contain nicotine or tobacco, such as cigarettes, e-cigarettes, and chewing tobacco. If you need help quitting, ask your health care provider.  Do not drink alcohol if: ? Your health care provider tells you not to drink. ? You are pregnant, may be pregnant, or are planning to become pregnant.  If you drink alcohol: ? Limit how much you use to:  0-1 drink a day for women.  0-2 drinks a day for men. ? Know how much alcohol is in your drink. In the [place] one drink equals one 12 oz bottle of beer (355 mL), one 5 oz glass of wine (148 mL), or one 1 oz glass of hard liquor (44 mL). General instructions  Take over-the-counter and prescription medicines only as told by your health care provider.  Keep all follow-up visits. This is important. This includes having regularly scheduled colonoscopies. Talk to your health care provider about when you need a colonoscopy. Contact a health care provider if:  You  have new or worsening bleeding during a bowel movement.  You have new or increased blood in your stool.  You have a change in bowel habits.  You lose weight for no known reason. Summary  Colon polyps are tissue growths inside the colon, which is part of the large intestine. They are one type of polyp that can grow in the body.  Most colon polyps are noncancerous (benign), but some can become cancerous over time.  This condition is diagnosed with a colonoscopy.  This condition is treated by removing any polyps that are found. Most polyps can be removed during a colonoscopy. This information is not intended to replace advice given to you by your health care provider. Make sure you discuss any questions you have with your health care provider. Document Revised: 10/06/2019 Document Reviewed: 10/06/2019 Elsevier Patient Education  2021 [employer].

## 2020-08-14 NOTE — H&P (Addendum)
Primary Care Physician:  Peggyann Shoals Primary Gastroenterologist:  Dr. Abbey Chatters  Pre-Procedure History & Physical: HPI:  Steven Woodard is a 45 y.o. male is here for a colonoscopy to be performed for high risk colon cancer screening purposes due to family history of father  colon cancer at age 24 No melena or hematochezia.  No abdominal pain or unintentional weight loss.    Past Medical History:  Diagnosis Date  . Arthritis   . GERD (gastroesophageal reflux disease)   . Hypertension   . PONV (postoperative nausea and vomiting)     Past Surgical History:  Procedure Laterality Date  . BIOPSY  05/05/2020   Procedure: BIOPSY;  Surgeon: Eloise Harman, DO;  Location: [facility];  Service: Endoscopy;;  . ESOPHAGOGASTRODUODENOSCOPY (EGD) WITH PROPOFOL N/A 05/05/2020   Procedure: ESOPHAGOGASTRODUODENOSCOPY (EGD) WITH PROPOFOL;  Surgeon: Eloise Harman, DO;  Location: [facility];  Service: Endoscopy;  Laterality: N/A;  12:45pm  . KNEE ARTHROSCOPY Left   . neck fusion      Prior to Admission medications   Medication Sig Start Date End Date Taking? Authorizing Provider  acetaminophen (TYLENOL) 500 MG tablet Take 1,000 mg by mouth every 6 (six) hours as needed (pain.).   Yes [provider]  amLODipine (NORVASC) 10 MG tablet Take 10 mg by mouth daily.   Yes [provider]  cholecalciferol (VITAMIN D3) 25 MCG (1000 UNIT) tablet Take 1,000 Units by mouth daily.   Yes [provider]  famotidine (PEPCID) 20 MG tablet Take 20 mg by mouth daily.   Yes [provider]  fenofibrate 160 MG tablet Take 160 mg by mouth daily. 04/29/20  Yes [provider]  gabapentin (NEURONTIN) 300 MG capsule Take 300 mg by mouth 3 (three) times daily as needed (nerve pain).    Yes [provider]  HYDROcodone-acetaminophen (NORCO/VICODIN) 5-325 MG tablet Take 1 tablet by mouth every 6 (six) hours as needed. Patient taking differently: Take 1  tablet by mouth every 6 (six) hours as needed (pain.). 05/02/20  Yes Horton, Barbette Hair, MD  Omega-3 Fatty Acids (FISH OIL) 1000 MG CAPS Take 1,000 mg by mouth every evening.    Yes [provider]  pantoprazole (PROTONIX) 40 MG tablet Take 1 tablet (40 mg total) by mouth 2 (two) times daily before a meal. Patient not taking: Reported on 08/04/2020 04/27/20   Carlis Stable, NP    Allergies as of 07/12/2020 - Review Complete 07/07/2020  Allergen Reaction Noted  . Aspirin  02/21/2015    Family History  Problem Relation Age of Onset  . Stroke Father   . Heart disease Father   . Colon cancer Father 89       From what he remembers, was a young child  . Gastric cancer Neg Hx   . Esophageal cancer Neg Hx     Social History   Socioeconomic History  . Marital status: Married    Spouse name: Not on file  . Number of children: Not on file  . Years of education: Not on file  . Highest education level: Not on file  Occupational History  . Not on file  Tobacco Use  . Smoking status: Current Every Day Smoker    Packs/day: 0.50    Years: 28.00    Pack years: 14.00    Types: Cigarettes  . Smokeless tobacco: Never Used  . Tobacco comment: vapes also  Vaping Use  . Vaping Use: Every day  Substance and Sexual Activity  . Alcohol use: No    Alcohol/week: 0.0 standard drinks  . Drug use: No  . Sexual activity: Yes    Partners: Female  Other Topics Concern  . Not on file  Social History Narrative   1 cup of coffee, 1 soda, 1 tea at night    Married   GED highest education   Employed as a Personal assistant Strain: Not on file  Food Insecurity: Not on file  Transportation Needs: Not on file  Physical Activity: Not on file  Stress: Not on file  Social Connections: Not on file  Intimate Partner Violence: Not on file    Review of Systems: See HPI, otherwise negative ROS  Physical Exam: General:   Alert,  Well-developed,  well-nourished, pleasant and cooperative in NAD Head:  Normocephalic and atraumatic. Eyes:  Sclera clear, no icterus.   Conjunctiva pink. Ears:  Normal auditory acuity. Nose:  No deformity, discharge,  or lesions. Mouth:  No deformity or lesions, dentition normal. Neck:  Supple; no masses or thyromegaly. Lungs:  Clear throughout to auscultation.   No wheezes, crackles, or rhonchi. No acute distress. Heart:  Regular rate and rhythm; no murmurs, clicks, rubs,  or gallops. Abdomen:  Soft, nontender and nondistended. No masses, hepatosplenomegaly or hernias noted. Normal bowel sounds, without guarding, and without rebound.   Msk:  Symmetrical without gross deformities. Normal posture. Pulses:  Normal pulses noted. Extremities:  Without clubbing or edema. Neurologic:  Alert and  oriented x4;  grossly normal neurologically. Skin:  Intact without significant lesions or rashes. Cervical Nodes:  No significant cervical adenopathy. Psych:  Alert and cooperative. Normal mood and affect.   Impression/Plan: Steven Woodard is here for a colonoscopy to be performed for high risk colon cancer screening purposes due to family history of father  colon cancer at age 54  The risks of the procedure including infection, bleed, or perforation as well as benefits, limitations, alternatives and imponderables have been reviewed with the patient. Questions have been answered. All parties agreeable.

## 2020-08-15 LAB — SURGICAL PATHOLOGY

## 2020-08-17 ENCOUNTER — Encounter (HOSPITAL_COMMUNITY): Payer: Self-pay | Admitting: Internal Medicine

## 2020-08-23 NOTE — Progress Notes (Signed)
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 We will need to repeat your colonoscopy in 5 years as discussed postoperatively. Follow-up with GI as needed.      Thank you, Floria Raveling, CMA

## 2020-09-21 ENCOUNTER — Emergency Department (HOSPITAL_COMMUNITY)
Admission: EM | Admit: 2020-09-21 | Discharge: 2020-09-21 | Disposition: A | Discharge status: Home / Self Care | Attending: Emergency Medicine | Admitting: Emergency Medicine

## 2020-09-21 ENCOUNTER — Encounter (HOSPITAL_COMMUNITY): Payer: Self-pay | Admitting: *Deleted

## 2020-09-21 ENCOUNTER — Other Ambulatory Visit: Payer: Self-pay

## 2020-09-21 ENCOUNTER — Emergency Department (HOSPITAL_COMMUNITY)

## 2020-09-21 DIAGNOSIS — S060X0A Concussion without loss of consciousness, initial encounter: Secondary | ICD-10-CM | POA: Diagnosis not present

## 2020-09-21 DIAGNOSIS — M47812 Spondylosis without myelopathy or radiculopathy, cervical region: Secondary | ICD-10-CM | POA: Diagnosis not present

## 2020-09-21 DIAGNOSIS — F17210 Nicotine dependence, cigarettes, uncomplicated: Secondary | ICD-10-CM | POA: Diagnosis not present

## 2020-09-21 DIAGNOSIS — M542 Cervicalgia: Secondary | ICD-10-CM | POA: Diagnosis not present

## 2020-09-21 DIAGNOSIS — M2578 Osteophyte, vertebrae: Secondary | ICD-10-CM | POA: Diagnosis not present

## 2020-09-21 DIAGNOSIS — I10 Essential (primary) hypertension: Secondary | ICD-10-CM | POA: Diagnosis not present

## 2020-09-21 DIAGNOSIS — W208XXA Other cause of strike by thrown, projected or falling object, initial encounter: Secondary | ICD-10-CM | POA: Insufficient documentation

## 2020-09-21 DIAGNOSIS — S0990XA Unspecified injury of head, initial encounter: Secondary | ICD-10-CM | POA: Diagnosis not present

## 2020-09-21 DIAGNOSIS — Z79899 Other long term (current) drug therapy: Secondary | ICD-10-CM | POA: Diagnosis not present

## 2020-09-21 DIAGNOSIS — S199XXA Unspecified injury of neck, initial encounter: Secondary | ICD-10-CM | POA: Diagnosis not present

## 2020-09-21 MED ORDER — METHOCARBAMOL 500 MG PO TABS: 500.0000 mg | ORAL_TABLET | Freq: Two times a day (BID) | ORAL | 0 refills | Status: AC | PRN

## 2020-09-21 MED ORDER — ACETAMINOPHEN 500 MG PO TABS
1000.0000 mg | ORAL_TABLET | Freq: Once | ORAL | Status: AC
  Administered 2020-09-21: 1000 mg via ORAL
  Filled 2020-09-21: qty 2

## 2020-09-21 NOTE — ED Notes (Addendum)
Pt ambulated to bathroom without difficulty. No increased dizziness or lightheadedness.

## 2020-09-21 NOTE — ED Notes (Signed)
Pt ambulated to ED room without difficulty and appears to be in NAD at this time.
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
## 2020-09-21 NOTE — ED Notes (Signed)
Pt transported to CT ?

## 2020-09-21 NOTE — ED Notes (Signed)
Pt returned from CT °

## 2020-09-21 NOTE — Discharge Instructions (Addendum)
Please read instructions below. You can take the Robaxin every 12 hours as needed for muscle spasm.  Be aware that this medication can make you drowsy You can treat your headache with over-the-counter medications such as tylenol as needed. Stay hydrated and get plenty of rest. Limit your screen time and complex thinking. Avoid any contact sports/activities to prevent re-injury to your head. Follow up with your primary care provider in 1 week for re-check and to be cleared to return to normal activity. Return to the ER if you develop severely worsening headache, changes in your vision, persistent vomiting, or new or concerning symptoms.
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
## 2020-10-02 ENCOUNTER — Emergency Department (HOSPITAL_COMMUNITY)

## 2020-10-02 ENCOUNTER — Emergency Department (HOSPITAL_COMMUNITY)
Admission: EM | Admit: 2020-10-02 | Discharge: 2020-10-02 | Disposition: A | Discharge status: Home / Self Care | Attending: Emergency Medicine | Admitting: Emergency Medicine

## 2020-10-02 ENCOUNTER — Other Ambulatory Visit: Payer: Self-pay

## 2020-10-02 ENCOUNTER — Encounter (HOSPITAL_COMMUNITY): Payer: Self-pay

## 2020-10-02 DIAGNOSIS — Z79899 Other long term (current) drug therapy: Secondary | ICD-10-CM | POA: Insufficient documentation

## 2020-10-02 DIAGNOSIS — F17210 Nicotine dependence, cigarettes, uncomplicated: Secondary | ICD-10-CM | POA: Insufficient documentation

## 2020-10-02 DIAGNOSIS — W228XXD Striking against or struck by other objects, subsequent encounter: Secondary | ICD-10-CM | POA: Diagnosis not present

## 2020-10-02 DIAGNOSIS — S060X0A Concussion without loss of consciousness, initial encounter: Secondary | ICD-10-CM | POA: Diagnosis not present

## 2020-10-02 DIAGNOSIS — I10 Essential (primary) hypertension: Secondary | ICD-10-CM | POA: Insufficient documentation

## 2020-10-02 DIAGNOSIS — S060X0D Concussion without loss of consciousness, subsequent encounter: Secondary | ICD-10-CM | POA: Diagnosis present

## 2020-10-02 DIAGNOSIS — R42 Dizziness and giddiness: Secondary | ICD-10-CM | POA: Diagnosis not present

## 2020-10-02 LAB — COMPREHENSIVE METABOLIC PANEL
ALT: 23 U/L (ref 0–44)
AST: 19 U/L (ref 15–41)
Albumin: 4.4 g/dL (ref 3.5–5.0)
Alkaline Phosphatase: 56 U/L (ref 38–126)
Anion gap: 9 (ref 5–15)
BUN: 18 mg/dL (ref 6–20)
CO2: 25 mmol/L (ref 22–32)
Calcium: 9.1 mg/dL (ref 8.9–10.3)
Chloride: 106 mmol/L (ref 98–111)
Creatinine, Ser: 1.01 mg/dL (ref 0.61–1.24)
GFR, Estimated: >60 mL/min (ref >60)
Glucose, Bld: 95 mg/dL (ref 70–99)
Potassium: 3.9 mmol/L (ref 3.5–5.1)
Sodium: 140 mmol/L (ref 135–145)
Total Bilirubin: 0.6 mg/dL (ref 0.3–1.2)
Total Protein: 7.5 g/dL (ref 6.5–8.1)

## 2020-10-02 LAB — CBC WITH DIFFERENTIAL/PLATELET
Abs Immature Granulocytes: 0.03 10*3/uL (ref 0.00–0.07)
Basophils Absolute: 0.1 10*3/uL (ref 0.0–0.1)
Basophils Relative: 1 %
Eosinophils Absolute: 0.3 10*3/uL (ref 0.0–0.5)
Eosinophils Relative: 3 %
HCT: 48.8 % (ref 39.0–52.0)
Hemoglobin: 16.1 g/dL (ref 13.0–17.0)
Immature Granulocytes: 0 %
Lymphocytes Relative: 23 %
Lymphs Abs: 2.2 10*3/uL (ref 0.7–4.0)
MCH: 30.3 pg (ref 26.0–34.0)
MCHC: 33 g/dL (ref 30.0–36.0)
MCV: 91.9 fL (ref 80.0–100.0)
Monocytes Absolute: 0.7 10*3/uL (ref 0.1–1.0)
Monocytes Relative: 7 %
Neutro Abs: 6.5 10*3/uL (ref 1.7–7.7)
Neutrophils Relative %: 66 %
Platelets: 269 10*3/uL (ref 150–400)
RBC: 5.31 MIL/uL (ref 4.22–5.81)
RDW: 12.4 % (ref 11.5–15.5)
WBC: 9.8 10*3/uL (ref 4.0–10.5)
nRBC: 0 % (ref 0.0–0.2)

## 2020-10-02 MED ORDER — ONDANSETRON 4 MG PO TBDP
4.0000 mg | ORAL_TABLET | Freq: Once | ORAL | Status: AC
  Administered 2020-10-02: 4 mg via ORAL
  Filled 2020-10-02: qty 1

## 2020-10-02 MED ORDER — ONDANSETRON 4 MG PO TBDP: ORAL_TABLET | ORAL | 0 refills | Status: AC

## 2020-10-02 NOTE — Discharge Instructions (Addendum)
Follow-up with Dr. Merlene Laughter in a week.  Get seen sooner if any problem
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
## 2021-07-13 DIAGNOSIS — M25512 Pain in left shoulder: Secondary | ICD-10-CM | POA: Diagnosis not present

## 2021-07-13 DIAGNOSIS — M5459 Other low back pain: Secondary | ICD-10-CM | POA: Diagnosis not present

## 2021-07-13 DIAGNOSIS — M5451 Vertebrogenic low back pain: Secondary | ICD-10-CM | POA: Diagnosis not present

## 2021-07-13 DIAGNOSIS — M25511 Pain in right shoulder: Secondary | ICD-10-CM | POA: Diagnosis not present

## 2021-07-25 DIAGNOSIS — M5416 Radiculopathy, lumbar region: Secondary | ICD-10-CM | POA: Diagnosis not present

## 2021-07-31 DIAGNOSIS — M5451 Vertebrogenic low back pain: Secondary | ICD-10-CM | POA: Diagnosis not present

## 2021-08-11 DIAGNOSIS — M5416 Radiculopathy, lumbar region: Secondary | ICD-10-CM | POA: Diagnosis not present

## 2021-08-16 DIAGNOSIS — M545 Low back pain, unspecified: Secondary | ICD-10-CM | POA: Diagnosis not present

## 2021-08-23 DIAGNOSIS — M545 Low back pain, unspecified: Secondary | ICD-10-CM | POA: Diagnosis not present

## 2021-08-24 DIAGNOSIS — M5451 Vertebrogenic low back pain: Secondary | ICD-10-CM | POA: Diagnosis not present

## 2021-08-29 DIAGNOSIS — M545 Low back pain, unspecified: Secondary | ICD-10-CM | POA: Diagnosis not present

## 2021-08-31 ENCOUNTER — Telehealth: Payer: BC Managed Care – PPO | Admitting: Physician Assistant

## 2021-08-31 DIAGNOSIS — J019 Acute sinusitis, unspecified: Secondary | ICD-10-CM | POA: Diagnosis not present

## 2021-08-31 DIAGNOSIS — B9689 Other specified bacterial agents as the cause of diseases classified elsewhere: Secondary | ICD-10-CM | POA: Diagnosis not present

## 2021-08-31 MED ORDER — AMOXICILLIN-POT CLAVULANATE 875-125 MG PO TABS: 1.0000 | ORAL_TABLET | Freq: Two times a day (BID) | ORAL | 0 refills | Status: AC

## 2021-08-31 NOTE — Progress Notes (Signed)

## 2021-09-06 DIAGNOSIS — M545 Low back pain, unspecified: Secondary | ICD-10-CM | POA: Diagnosis not present

## 2021-09-20 DIAGNOSIS — M545 Low back pain, unspecified: Secondary | ICD-10-CM | POA: Diagnosis not present

## 2021-10-04 DIAGNOSIS — M545 Low back pain, unspecified: Secondary | ICD-10-CM | POA: Diagnosis not present

## 2022-08-28 DIAGNOSIS — H40053 Ocular hypertension, bilateral: Secondary | ICD-10-CM | POA: Diagnosis not present

## 2022-09-25 DIAGNOSIS — H401121 Primary open-angle glaucoma, left eye, mild stage: Secondary | ICD-10-CM | POA: Diagnosis not present

## 2022-11-27 DIAGNOSIS — H401121 Primary open-angle glaucoma, left eye, mild stage: Secondary | ICD-10-CM | POA: Diagnosis not present

## 2023-01-21 DIAGNOSIS — H401121 Primary open-angle glaucoma, left eye, mild stage: Secondary | ICD-10-CM | POA: Diagnosis not present

## 2023-05-27 DIAGNOSIS — H401121 Primary open-angle glaucoma, left eye, mild stage: Secondary | ICD-10-CM | POA: Diagnosis not present

## 2023-09-24 ENCOUNTER — Other Ambulatory Visit: Payer: Self-pay

## 2023-09-24 ENCOUNTER — Emergency Department (HOSPITAL_COMMUNITY)

## 2023-09-24 ENCOUNTER — Encounter (HOSPITAL_COMMUNITY): Payer: Self-pay

## 2023-09-24 ENCOUNTER — Emergency Department (HOSPITAL_COMMUNITY)
Admission: EM | Admit: 2023-09-24 | Discharge: 2023-09-25 | Disposition: A | Discharge status: Home / Self Care | Attending: Emergency Medicine | Admitting: Emergency Medicine

## 2023-09-24 DIAGNOSIS — R0789 Other chest pain: Secondary | ICD-10-CM | POA: Insufficient documentation

## 2023-09-24 DIAGNOSIS — Z8249 Family history of ischemic heart disease and other diseases of the circulatory system: Secondary | ICD-10-CM | POA: Insufficient documentation

## 2023-09-24 DIAGNOSIS — R079 Chest pain, unspecified: Secondary | ICD-10-CM | POA: Diagnosis present

## 2023-09-24 DIAGNOSIS — R202 Paresthesia of skin: Secondary | ICD-10-CM | POA: Insufficient documentation

## 2023-09-24 LAB — COMPREHENSIVE METABOLIC PANEL
ALT: 49 U/L — ABNORMAL HIGH (ref 0–44)
AST: 37 U/L (ref 15–41)
Albumin: 4.5 g/dL (ref 3.5–5.0)
Alkaline Phosphatase: 56 U/L (ref 38–126)
Anion gap: 11 (ref 5–15)
BUN: 16 mg/dL (ref 6–20)
CO2: 24 mmol/L (ref 22–32)
Calcium: 9.4 mg/dL (ref 8.9–10.3)
Chloride: 104 mmol/L (ref 98–111)
Creatinine, Ser: 1.1 mg/dL (ref 0.61–1.24)
GFR, Estimated: >60 mL/min (ref >60)
Glucose, Bld: 94 mg/dL (ref 70–99)
Potassium: 3.4 mmol/L — ABNORMAL LOW (ref 3.5–5.1)
Sodium: 139 mmol/L (ref 135–145)
Total Bilirubin: 0.5 mg/dL (ref 0.0–1.2)
Total Protein: 7.9 g/dL (ref 6.5–8.1)

## 2023-09-24 LAB — CBC
HCT: 44.3 % (ref 39.0–52.0)
Hemoglobin: 15 g/dL (ref 13.0–17.0)
MCH: 30.1 pg (ref 26.0–34.0)
MCHC: 33.9 g/dL (ref 30.0–36.0)
MCV: 89 fL (ref 80.0–100.0)
Platelets: 311 10*3/uL (ref 150–400)
RBC: 4.98 MIL/uL (ref 4.22–5.81)
RDW: 12.2 % (ref 11.5–15.5)
WBC: 9.3 10*3/uL (ref 4.0–10.5)
nRBC: 0 % (ref 0.0–0.2)

## 2023-09-24 LAB — MAGNESIUM: Magnesium: 2 mg/dL (ref 1.7–2.4)

## 2023-09-24 LAB — TROPONIN I (HIGH SENSITIVITY)
Troponin I (High Sensitivity): 8 ng/L (ref <18)
Troponin I (High Sensitivity): 9 ng/L (ref <18)

## 2023-09-24 MED ORDER — KETOROLAC TROMETHAMINE 15 MG/ML IJ SOLN
30.0000 mg | Freq: Once | INTRAMUSCULAR | Status: AC
  Administered 2023-09-25: 30 mg via INTRAVENOUS
  Filled 2023-09-24: qty 2

## 2023-09-24 MED ORDER — ACETAMINOPHEN 500 MG PO TABS
1000.0000 mg | ORAL_TABLET | Freq: Once | ORAL | Status: AC
  Administered 2023-09-24: 1000 mg via ORAL
  Filled 2023-09-24: qty 2

## 2023-09-24 MED ORDER — IOHEXOL 350 MG/ML SOLN
100.0000 mL | Freq: Once | INTRAVENOUS | Status: AC | PRN
  Administered 2023-09-24: 100 mL via INTRAVENOUS

## 2023-09-24 NOTE — ED Provider Notes (Signed)
 [facility] AT [facility] Provider Note   CSN: [id number] Arrival date & time: 09/24/23  1755     History  Chief Complaint  Patient presents with   Chest Pain    Steven Woodard is a 48 y.o. male with PMH as listed below who arrived via POV from home c/o left chest pain and numbness and tingling that radiates to his left fingers. 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 Wasn't particularly exerting himself when it started. Pt also reports previous C5-C6 surgery. No new neck pain or neck trauma. No headache, head trauma, asymmetric weakness, dysphagia, slurred speech, trouble walking. No associated nausea/vomiting, SOB, or diaphoresis. No cough/flu-like sxs, leg swelling. No h/o DVT/PE. No recent travel, hospitalizations, surgeries.   Past Medical History:  Diagnosis Date   Arthritis    GERD (gastroesophageal reflux disease)    Hypertension    PONV (postoperative nausea and vomiting)        Home Medications Prior to Admission medications   Medication Sig Start Date End Date Taking? Authorizing Provider  acetaminophen (TYLENOL) 500 MG tablet Take 1,000 mg by mouth every 6 (six) hours as needed (pain.).    [provider]  amLODipine (NORVASC) 10 MG tablet Take 10 mg by mouth daily.    [provider]  amoxicillin-clavulanate (AUGMENTIN) 875-125 MG tablet Take 1 tablet by mouth 2 (two) times daily. 08/31/21   Margaretann Loveless, PA-C  cholecalciferol (VITAMIN D3) 25 MCG (1000 UNIT) tablet Take 1,000 Units by mouth daily.    [provider]  famotidine (PEPCID) 20 MG tablet Take 20 mg by mouth daily.    [provider]  fenofibrate 160 MG tablet Take 160 mg by mouth daily. 04/29/20   [provider]  gabapentin (NEURONTIN) 300 MG capsule Take 300 mg by mouth 3 (three) times daily as needed (nerve pain).     [provider]  HYDROcodone-acetaminophen  (NORCO/VICODIN) 5-325 MG tablet Take 1 tablet by mouth every 6 (six) hours as needed. Patient taking differently: Take 1 tablet by mouth every 6 (six) hours as needed (pain.). 05/02/20   Horton, Mayer Masker, MD  methocarbamol (ROBAXIN) 500 MG tablet Take 1 tablet (500 mg total) by mouth 2 (two) times daily as needed for muscle spasms. 09/21/20   Robinson, [place] N, PA-C  Omega-3 Fatty Acids (FISH OIL) 1000 MG CAPS Take 1,000 mg by mouth every evening.     [provider]  omeprazole (PRILOSEC) 20 MG capsule Take 1 capsule (20 mg total) by mouth 2 (two) times daily before a meal. Take 30 min before breakfast and 30 min before dinner 08/14/20 02/10/21  Lanelle Bal, DO  ondansetron (ZOFRAN ODT) 4 MG disintegrating tablet 4mg  ODT q4 hours prn nausea/vomit 10/02/20   Bethann Berkshire, MD      Allergies    Aspirin    Review of Systems   Review of Systems A 10 point review of systems was performed and is negative unless otherwise reported in HPI.  Physical Exam Updated Vital Signs BP (!) 145/87   Pulse 71   Temp 98.8 F (37.1 C) (Oral)   Resp 20   Ht 5\' 8"  (1.727 m)   Wt 85 kg   SpO2 100%   BMI 28.49 kg/m  Physical Exam General: Normal appearing male, lying in bed.  HEENT: NCAT.  PERRLA, EOMI, no nystagmus sclera anicteric, MMM, trachea midline.  No facial droop.  Tongue protrudes midline.  No C-spine tenderness palpation, step-offs, or deformities. Cardiology: RRR, no murmurs/rubs/gallops. BL radial and DP pulses equal bilaterally.  Resp: Normal respiratory rate and effort. CTAB, no wheezes, rhonchi, crackles.  Abd: Soft, non-tender, non-distended. No rebound tenderness or guarding.  GU: Deferred. MSK: No peripheral edema or signs of trauma. Extremities without deformity or TTP. No cyanosis or clubbing. Skin: warm, dry.  Back: No CVA tenderness Neuro: A&Ox4, CNs II-XII grossly intact.  5 out of 5 strength in all extremities.  Sensation grossly intact.  Normal speech Psych:  Normal mood and affect.   ED Results / Procedures / Treatments   Labs (all labs ordered are listed, but only abnormal results are displayed) Labs Reviewed  COMPREHENSIVE METABOLIC PANEL - Abnormal; Notable for the following components:      Result Value   Potassium 3.4 (*)    ALT 49 (*)    All other components within normal limits  CBC  MAGNESIUM  TROPONIN I (HIGH SENSITIVITY)  TROPONIN I (HIGH SENSITIVITY)    EKG EKG Interpretation Date/Time:  Wednesday September 24 2023 18:06:31 EDT Ventricular Rate:  75 PR Interval:  158 QRS Duration:  102 QT Interval:  370 QTC Calculation: 413 R Axis:   -18  Text Interpretation: Normal sinus rhythm Septal infarct , age undetermined Similar to prior Confirmed by Vivi Barrack [id number]) on 09/24/2023 6:32:21 PM  Radiology No results found.  Procedures Procedures    Medications Ordered in ED Medications  acetaminophen (TYLENOL) tablet 1,000 mg (1,000 mg Oral Given 09/24/23 2122)  iohexol (OMNIPAQUE) 350 MG/ML injection 100 mL (100 mLs Intravenous Contrast Given 09/24/23 2210)  ketorolac (TORADOL) 15 MG/ML injection 30 mg (30 mg Intravenous Given 09/25/23 0001)    ED Course/ Medical Decision Making/ A&P                          Medical Decision Making Amount and/or Complexity of Data Reviewed Labs: ordered. Radiology: ordered. Decision-making details documented in ED Course.  Risk OTC drugs. Prescription drug management.    This patient presents to the ED for concern of chest pain, numbness tingling, this involves an extensive number of treatment options, and is a complaint that carries with it a high risk of complications and morbidity.    MDM:    DDX for chest pain includes but is not limited to:  Very low suspicion for ACS vs aortic dissection.  EKG does not demonstrate any ischemia and troponin is negative.  Consider dissection with chest pain and neurologic symptoms however CT dissection study is negative.  Patient does PERC  out for PE and CT is negative for PE as well.. No abdominal pain and no c/f biliary disease.  Does have a small pulmonary nodule that patient is made aware of. Patient states that the numbness tingling is similar to the numbness tingling it felt prior to the cervical symptoms.  It is intermittent and comes and goes, no constant symptoms, lower concern for CVA or TIA.  He has no new cervical symptoms at this time.  CT C-spine does not demonstrate any new findings.  Patient feels improved after Tylenol and Toradol.  He is overall very well-appearing.  I considered admission for this patient but believe he is stable for discharge and outpatient follow-up.  Advised patient to follow-up with his spine doctor as well as cardiologist.   Clinical Course as of 10/02/23 0632  Wed Sep 24, 2023  1948 CBC, CMP, Mg, and  initial trop unremarkable. [HN]  1949 CXR unremarkable. [HN]  2320 CT Cervical Spine Wo Contrast Degenerative and postoperative changes without acute abnormality. [HN]  2320 CT Angio Chest Aorta W and/or Wo Contrast No evidence of pulmonary emboli.  Right solid pulmonary nodule within the upper lobe measuring 3 mm.  Per Fleischner Society Guidelines, if patient is low risk for malignancy, no routine follow-up imaging is recommended. If patient is high risk for malignancy, a non-contrast Chest CT at 12 months is optional. If performed and the nodule is stable at 12 months, no further follow-up is recommended.  These guidelines do not apply to immunocompromised patients and patients with cancer. Follow up in patients with significant comorbidities as clinically warranted. For lung cancer screening, adhere to Lung-RADS guidelines. Reference: Radiology. 2017; 284(1):228-43.   [HN]  2346 Patient with HEART score 3. Can f/u with cardiology. [HN]    Clinical Course User Index [HN] Loetta Rough, MD    Labs: I Ordered, and personally interpreted labs.  The pertinent results include:  Those listed above  Imaging Studies ordered: I ordered imaging studies including chest x-ray, CT C-spine, CT dissection study I independently visualized and interpreted imaging. I agree with the radiologist interpretation  Additional history obtained from chart review.    Cardiac Monitoring: The patient was maintained on a cardiac monitor.  I personally viewed and interpreted the cardiac monitored which showed an underlying rhythm of: Normal sinus rhythm  Reevaluation: After the interventions noted above, I reevaluated the patient and found that they have :improved  Social Determinants of Health:  lives independently  Disposition:  DC w/ discharge instructions/return precautions. All questions answered to patient's satisfaction.    Co morbidities that complicate the patient evaluation  Past Medical History:  Diagnosis Date   Arthritis    GERD (gastroesophageal reflux disease)    Hypertension    PONV (postoperative nausea and vomiting)      Medicines No orders of the defined types were placed in this encounter.   I have reviewed the patients home medicines and have made adjustments as needed  Problem List / ED Course: Problem List Items Addressed This Visit   None Visit Diagnoses       Atypical chest pain    -  Primary     Paresthesias                       This note was created using dictation software, which may contain spelling or grammatical errors.    Loetta Rough, MD 10/02/23 [phone]

## 2023-09-24 NOTE — Discharge Instructions (Addendum)
 Thank you for coming to [facility] Emergency Department. You were seen for chest pain and tingling in the left arm. We did an exam, labs, and imaging, and these showed no acute findings.  Please follow up with your primary care provider within 1 week. Please also follow up with your cardiologist in 1-2 weeks related to your chest pain. They may want to do further testing.   Your imaging also noted a left upper lobe nodule in the lung. This does not require further testing now but please follow up about this with your primary care physician.  Do not hesitate to return to the ED or call 911 if you experience: -Worsening symptoms -Shortness of breath, chest pain -Asymmetric numbness, weakness -Lightheadedness, passing out -Fevers/chills -Anything else that concerns you

## 2023-09-24 NOTE — ED Notes (Signed)
 Ambulated to bathroom

## 2023-09-24 NOTE — ED Triage Notes (Signed)
 Pt arrived via POV from home c/o left chest pain and numbness and tingling that radiates to his left fingers. 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 Pt also reports previous C5-C6 surgery.
